# Patient Record
Sex: Male | Born: 1955 | Hispanic: No | Marital: Married | Smoking: Current some day smoker
Health system: Southern US, Community
[De-identification: ages and names within clinical notes are randomized; demographics above are authoritative.]

## PROBLEM LIST (undated history)

## (undated) DIAGNOSIS — F329 Major depressive disorder, single episode, unspecified: Secondary | ICD-10-CM

## (undated) DIAGNOSIS — I10 Essential (primary) hypertension: Secondary | ICD-10-CM

## (undated) DIAGNOSIS — F1910 Other psychoactive substance abuse, uncomplicated: Secondary | ICD-10-CM

## (undated) DIAGNOSIS — F4310 Post-traumatic stress disorder, unspecified: Secondary | ICD-10-CM

## (undated) DIAGNOSIS — F17200 Nicotine dependence, unspecified, uncomplicated: Secondary | ICD-10-CM

## (undated) DIAGNOSIS — E669 Obesity, unspecified: Secondary | ICD-10-CM

## (undated) HISTORY — PX: INGUINAL HERNIA REPAIR: SUR1180

## (undated) HISTORY — DX: Essential (primary) hypertension: I10

## (undated) HISTORY — DX: Obesity, unspecified: E66.9

## (undated) HISTORY — DX: Major depressive disorder, single episode, unspecified: F32.9

## (undated) HISTORY — PX: TESTICLE SURGERY: SHX794

## (undated) HISTORY — DX: Nicotine dependence, unspecified, uncomplicated: F17.200

---

## 2008-12-04 ENCOUNTER — Emergency Department (HOSPITAL_COMMUNITY): Admission: EM | Admit: 2008-12-04 | Discharge: 2008-12-04 | Payer: Self-pay | Admitting: Emergency Medicine
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
## 2011-11-20 ENCOUNTER — Ambulatory Visit: Payer: Self-pay

## 2011-11-20 DIAGNOSIS — Z0289 Encounter for other administrative examinations: Secondary | ICD-10-CM

## 2016-12-30 ENCOUNTER — Emergency Department (HOSPITAL_COMMUNITY): Payer: 59

## 2016-12-30 ENCOUNTER — Emergency Department (HOSPITAL_COMMUNITY)
Admission: EM | Admit: 2016-12-30 | Discharge: 2016-12-30 | Disposition: A | Discharge status: Home / Self Care | Payer: 59 | Attending: Emergency Medicine | Admitting: Emergency Medicine

## 2016-12-30 ENCOUNTER — Encounter (HOSPITAL_COMMUNITY): Payer: Self-pay

## 2016-12-30 DIAGNOSIS — R030 Elevated blood-pressure reading, without diagnosis of hypertension: Secondary | ICD-10-CM | POA: Insufficient documentation

## 2016-12-30 DIAGNOSIS — F17200 Nicotine dependence, unspecified, uncomplicated: Secondary | ICD-10-CM | POA: Insufficient documentation

## 2016-12-30 DIAGNOSIS — M954 Acquired deformity of chest and rib: Secondary | ICD-10-CM | POA: Insufficient documentation

## 2016-12-30 DIAGNOSIS — Z7982 Long term (current) use of aspirin: Secondary | ICD-10-CM | POA: Diagnosis not present

## 2016-12-30 DIAGNOSIS — R0781 Pleurodynia: Secondary | ICD-10-CM | POA: Diagnosis present

## 2016-12-30 LAB — URINALYSIS, ROUTINE W REFLEX MICROSCOPIC
BACTERIA UA: NONE SEEN
Bilirubin Urine: NEGATIVE
Glucose, UA: NEGATIVE mg/dL
Ketones, ur: NEGATIVE mg/dL
Leukocytes, UA: NEGATIVE
Nitrite: NEGATIVE
PROTEIN: 30 mg/dL — AB
SPECIFIC GRAVITY, URINE: 1.026 (ref 1.005–1.030)
pH: 5 (ref 5.0–8.0)

## 2016-12-30 LAB — CBC
HEMATOCRIT: 46.1 % (ref 39.0–52.0)
HEMOGLOBIN: 16.2 g/dL (ref 13.0–17.0)
MCH: 33.1 pg (ref 26.0–34.0)
MCHC: 35.1 g/dL (ref 30.0–36.0)
MCV: 94.3 fL (ref 78.0–100.0)
Platelets: 205 10*3/uL (ref 150–400)
RBC: 4.89 MIL/uL (ref 4.22–5.81)
RDW: 13.5 % (ref 11.5–15.5)
WBC: 9.1 10*3/uL (ref 4.0–10.5)

## 2016-12-30 LAB — COMPREHENSIVE METABOLIC PANEL
ALBUMIN: 4.6 g/dL (ref 3.5–5.0)
ALK PHOS: 58 U/L (ref 38–126)
ALT: 36 U/L (ref 17–63)
ANION GAP: 7 (ref 5–15)
AST: 36 U/L (ref 15–41)
BILIRUBIN TOTAL: 0.5 mg/dL (ref 0.3–1.2)
BUN: 13 mg/dL (ref 6–20)
CALCIUM: 9.5 mg/dL (ref 8.9–10.3)
CO2: 26 mmol/L (ref 22–32)
Chloride: 104 mmol/L (ref 101–111)
Creatinine, Ser: 0.91 mg/dL (ref 0.61–1.24)
GFR calc Af Amer: >60 mL/min (ref >60)
GFR calc non Af Amer: >60 mL/min (ref >60)
GLUCOSE: 119 mg/dL — AB (ref 65–99)
Potassium: 4.3 mmol/L (ref 3.5–5.1)
SODIUM: 137 mmol/L (ref 135–145)
TOTAL PROTEIN: 8 g/dL (ref 6.5–8.1)

## 2016-12-30 LAB — LIPASE, BLOOD: Lipase: 23 U/L (ref 11–51)

## 2016-12-30 NOTE — ED Provider Notes (Signed)
[place] DEPT Provider Note   CSN: [id number] Arrival date & time: 12/30/16  1147     History   Chief Complaint Chief Complaint  Patient presents with  . Abdominal Pain    HPI William Pitts is a 61 y.o. male.  Patient is a 61 year old male who presents with right lower rib pain. He states it started about 4 months ago when he an air conditioner to install. He states it's been hurting intermittently since that time. It mostly comes on when he is lifting something or turning a certain way. It's nonradiating. He denies any abdominal pain. He denies any nausea or vomiting. He has normal appetite. No bloating. He did have an episode of vomiting and diarrhea yesterday which he feels is more of a stomach virus. He's had no further episodes since that time. He denies any fevers. No pleuritic symptoms. No shortness of breath. He immigrated here from [place]. He's been here for 18 years but has not seen a PCP during that time. He does say that he has health insurance just hasn't established care with a PCP. He does note that his blood pressures have been persistently [id number] systolic and several years ago he just stopped checking it.      History reviewed. No pertinent past medical history.  There are no active problems to display for this patient.   History reviewed. No pertinent surgical history.     Home Medications    Prior to Admission medications   Medication Sig Start Date End Date Taking? Authorizing Provider  aspirin EC 81 MG tablet Take 81 mg by mouth daily.   Yes Historical Provider, MD  CALCIUM PO Take 1 tablet by mouth daily.   Yes Historical Provider, MD  GARLIC PO Take 1 capsule by mouth 3 (three) times daily.   Yes Historical Provider, MD  Omega-3 Fatty Acids (FISH OIL PO) Take 1 capsule by mouth daily.   Yes Historical Provider, MD    Family History History reviewed. No pertinent family history.  Social History Social History  Substance Use Topics  .  Smoking status: Current Every Day Smoker  . Smokeless tobacco: Never Used  . Alcohol use Yes     Comment: social     Allergies   Patient has no known allergies.   Review of Systems Review of Systems  Constitutional: Negative for chills, diaphoresis, fatigue and fever.  HENT: Negative for congestion, rhinorrhea and sneezing.   Eyes: Negative.   Respiratory: Negative for cough, chest tightness and shortness of breath.   Cardiovascular: Positive for chest pain. Negative for leg swelling.  Gastrointestinal: Positive for diarrhea, nausea and vomiting. Negative for abdominal pain and blood in stool.  Genitourinary: Negative for difficulty urinating, flank pain, frequency and hematuria.  Musculoskeletal: Negative for arthralgias and back pain.  Skin: Negative for rash.  Neurological: Negative for dizziness, speech difficulty, weakness, numbness and headaches.     Physical Exam Updated Vital Signs BP (!) 166/102 (BP Location: Right Arm)   Pulse 99   Temp 98.9 F (37.2 C) (Oral)   Resp 20   SpO2 98%   Physical Exam  Constitutional: He is oriented to person, place, and time. He appears well-developed and well-nourished.  HENT:  Head: Normocephalic and atraumatic.  Eyes: Pupils are equal, round, and reactive to light.  Neck: Normal range of motion. Neck supple.  Cardiovascular: Normal rate, regular rhythm and normal heart sounds.   Pulmonary/Chest: Effort normal and breath sounds normal. No respiratory distress. He has  no wheezes. He has no rales. He exhibits tenderness.  Point tenderness to the anterior right lower ribs. There is no underlying abdominal tenderness. No overlying rash. No crepitus or deformity.  Abdominal: Soft. Bowel sounds are normal. There is no tenderness. There is no rebound and no guarding.  Musculoskeletal: Normal range of motion. He exhibits no edema.  No edema or calf tenderness  Lymphadenopathy:    He has no cervical adenopathy.  Neurological: He is  alert and oriented to person, place, and time.  Skin: Skin is warm and dry. No rash noted.  Psychiatric: He has a normal mood and affect.     ED Treatments / Results  Labs (all labs ordered are listed, but only abnormal results are displayed) Labs Reviewed  COMPREHENSIVE METABOLIC PANEL - Abnormal; Notable for the following:       Result Value   Glucose, Bld 119 (*)    All other components within normal limits  URINALYSIS, ROUTINE W REFLEX MICROSCOPIC - Abnormal; Notable for the following:    Color, Urine AMBER (*)    Hgb urine dipstick SMALL (*)    Protein, ur 30 (*)    Squamous Epithelial / LPF 0-5 (*)    All other components within normal limits  LIPASE, BLOOD  CBC    EKG  EKG Interpretation None       Radiology Dg Ribs Unilateral W/chest Right  Result Date: 12/30/2016 CLINICAL DATA:  RIGHT lower anterior rib cage pain beginning 5 months ago, question related to muscle injury at work, went away a month ago now back, vomited once last night, smoker EXAM: RIGHT RIBS AND CHEST - 3+ VIEW COMPARISON:  None FINDINGS: Normal heart size, mediastinal contours, and pulmonary vascularity. Minimal peribronchial thickening. Lungs otherwise clear. No infiltrate, pleural effusion or pneumothorax. BB placed at site of symptoms lower RIGHT chest. Question expansile bone lesion at the anterolateral RIGHT fifth rib. Additionally, enlargement of the posterior RIGHT twelfth rib question expansile lesion. No definite acute rib fracture. IMPRESSION: Question expansile bone lesions at the posterior RIGHT twelfth and anterolateral RIGHT fifth ribs. Consider radionuclide bone scan determine if abnormal metabolic activity is seen at these sites in order to assess for potential osseous metastatic disease. Electronically Signed   By: Lavonia Dana M.D.   On: 12/30/2016 18:10    Procedures Procedures (including critical care time)  Medications Ordered in ED Medications - No data to display   Initial  Impression / Assessment and Plan / ED Course  I have reviewed the triage vital signs and the nursing notes.  Pertinent labs & imaging results that were available during my care of the patient were reviewed by me and considered in my medical decision making (see chart for details).     Patient presents with right-sided rib pain. It's reproducible on palpation. There is no shortness of breath, pleuritic symptoms or other symptoms that would be more suggestive of pulmonary embolus. Chest x-ray shows some abnormal rib lesions. He does have insurance and had given him resources to call for establishment with a primary care provider. He will need a bone scan. I encouraged him to do this as soon as possible. His blood pressure has been elevated in the ED. I advised him he wanted to have this followed as well by his PCP.  Final Clinical Impressions(s) / ED Diagnoses   Final diagnoses:  Rib deformity  Elevated BP without diagnosis of hypertension    New Prescriptions New Prescriptions   No medications on file  Malvin Johns, MD 12/30/16 [phone]

## 2016-12-30 NOTE — ED Triage Notes (Signed)
Pt states 5 months ago he started having rt abdominal pain. Felt related to muscle injury from work.  Pt states it went away about 1 month ago.  Now it is coming back.  However, pt also describes vomiting x 1 last night.

## 2016-12-30 NOTE — Discharge Instructions (Signed)
You have some abnormal lesions to right ribs. He need a follow-up bone scan. Please establish care as soon as possible with her primary care provider to obtain his follow-up.

## 2017-01-08 ENCOUNTER — Telehealth: Payer: Self-pay | Admitting: Family Medicine

## 2017-01-08 ENCOUNTER — Encounter: Payer: Self-pay | Admitting: Family Medicine

## 2017-01-08 ENCOUNTER — Ambulatory Visit (INDEPENDENT_AMBULATORY_CARE_PROVIDER_SITE_OTHER): Payer: 59 | Admitting: Family Medicine

## 2017-01-08 VITALS — BP 148/86 | HR 80 | Temp 98.2°F | Ht 68.5 in | Wt 214.2 lb

## 2017-01-08 DIAGNOSIS — Q766 Other congenital malformations of ribs: Secondary | ICD-10-CM | POA: Insufficient documentation

## 2017-01-08 DIAGNOSIS — F17200 Nicotine dependence, unspecified, uncomplicated: Secondary | ICD-10-CM | POA: Insufficient documentation

## 2017-01-08 DIAGNOSIS — I10 Essential (primary) hypertension: Secondary | ICD-10-CM

## 2017-01-08 DIAGNOSIS — F329 Major depressive disorder, single episode, unspecified: Secondary | ICD-10-CM

## 2017-01-08 DIAGNOSIS — E669 Obesity, unspecified: Secondary | ICD-10-CM

## 2017-01-08 HISTORY — DX: Essential (primary) hypertension: I10

## 2017-01-08 HISTORY — DX: Major depressive disorder, single episode, unspecified: F32.9

## 2017-01-08 HISTORY — DX: Nicotine dependence, unspecified, uncomplicated: F17.200

## 2017-01-08 HISTORY — DX: Obesity, unspecified: E66.9

## 2017-01-08 LAB — COMPREHENSIVE METABOLIC PANEL
ALT: 38 U/L (ref 0–53)
AST: 31 U/L (ref 0–37)
Albumin: 4.3 g/dL (ref 3.5–5.2)
Alkaline Phosphatase: 61 U/L (ref 39–117)
BUN: 13 mg/dL (ref 6–23)
CO2: 30 mEq/L (ref 19–32)
Calcium: 9.4 mg/dL (ref 8.4–10.5)
Chloride: 108 mEq/L (ref 96–112)
Creatinine, Ser: 0.83 mg/dL (ref 0.40–1.50)
GFR: 100.78 mL/min (ref >60.00)
Glucose, Bld: 101 mg/dL — ABNORMAL HIGH (ref 70–99)
Potassium: 4.8 mEq/L (ref 3.5–5.1)
Sodium: 140 mEq/L (ref 135–145)
Total Bilirubin: 0.5 mg/dL (ref 0.2–1.2)
Total Protein: 7 g/dL (ref 6.0–8.3)

## 2017-01-08 LAB — CBC WITH DIFFERENTIAL/PLATELET
Basophils Absolute: 0.1 10*3/uL (ref 0.0–0.1)
Basophils Relative: 0.7 % (ref 0.0–3.0)
Eosinophils Absolute: 0.2 10*3/uL (ref 0.0–0.7)
Eosinophils Relative: 1.9 % (ref 0.0–5.0)
HCT: 47.1 % (ref 39.0–52.0)
Hemoglobin: 15.8 g/dL (ref 13.0–17.0)
Lymphocytes Relative: 29.9 % (ref 12.0–46.0)
Lymphs Abs: 2.5 10*3/uL (ref 0.7–4.0)
MCHC: 33.6 g/dL (ref 30.0–36.0)
MCV: 99 fl (ref 78.0–100.0)
Monocytes Absolute: 0.8 10*3/uL (ref 0.1–1.0)
Monocytes Relative: 9.7 % (ref 3.0–12.0)
Neutro Abs: 4.8 10*3/uL (ref 1.4–7.7)
Neutrophils Relative %: 57.8 % (ref 43.0–77.0)
Platelets: 287 10*3/uL (ref 150.0–400.0)
RBC: 4.76 Mil/uL (ref 4.22–5.81)
RDW: 13.9 % (ref 11.5–15.5)
WBC: 8.2 10*3/uL (ref 4.0–10.5)

## 2017-01-08 LAB — LIPID PANEL
Cholesterol: 186 mg/dL (ref 0–200)
HDL: 69.7 mg/dL (ref >39.00)
LDL Cholesterol: 108 mg/dL — ABNORMAL HIGH (ref 0–99)
NonHDL: 116.7
Total CHOL/HDL Ratio: 3
Triglycerides: 43 mg/dL (ref 0.0–149.0)
VLDL: 8.6 mg/dL (ref 0.0–40.0)

## 2017-01-08 LAB — PSA: PSA: 1.58 ng/mL (ref 0.10–4.00)

## 2017-01-08 MED ORDER — LISINOPRIL-HYDROCHLOROTHIAZIDE 10-12.5 MG PO TABS: 1.0000 | ORAL_TABLET | Freq: Every day | ORAL | 1 refills | Status: DC

## 2017-01-08 NOTE — Progress Notes (Signed)
Pre visit review using our clinic review tool, if applicable. No additional management support is needed unless otherwise documented below in the visit note. 

## 2017-01-08 NOTE — Telephone Encounter (Signed)
Call patient. HTN med sent. StarT at half dose x 2 weeks. Come back for nurse visit for BP check and recheck BMP.

## 2017-01-08 NOTE — Progress Notes (Signed)
William Pitts is a 61 y.o. male is here to [place].    History of Present Illness:    1. Abnormality of rib determined by X-ray. The patient went to the ER last week for right lower rib pain. An xray done at that time:  Question expansile bone lesions at the posterior RIGHT twelfth and anterolateral RIGHT fifth ribs. General ROS: negative for - chills, fever, night sweats or weight loss.   2. HTN, goal below 140/80. No Hx of taking medications. BP elevated at recent visit to Er. Cardiovascular ROS: negative for - chest pain, dyspnea on exertion, edema or palpitations.    3. Smoker. Since he was a teenager. Not interested in cessation.    4. Reactive depression. Son committed suicide last month. Has support at home. No SI/HI.    5. Obesity (BMI 30-39.9). Not watching diet or exercise.     Health Maintenance Due  Topic Date Due  . Hepatitis C Screening  [birth year]  . TETANUS/TDAP  01/01/1977  . COLONOSCOPY  01/02/2008    PMHx, SurgHx, SocialHx, Medications, and Allergies were reviewed in the Visit Navigator and updated as appropriate.    Past Medical History:  Diagnosis Date  . Smoker     History reviewed. No pertinent surgical history.  History reviewed. No pertinent family history.  Social History  Substance Use Topics  . Smoking status: Current Every Day Smoker    Packs/day: 1.00    Types: Cigarettes  . Smokeless tobacco: Never Used  . Alcohol use Yes     Comment: social     Current Medications and Allergies:    Current Outpatient Prescriptions:  .  aspirin EC 81 MG tablet, Take 81 mg by mouth daily., Disp: , Rfl:  .  CALCIUM PO, Take 1 tablet by mouth daily., Disp: , Rfl:  .  GARLIC PO, Take 1 capsule by mouth 3 (three) times daily., Disp: , Rfl:  .  Omega-3 Fatty Acids (FISH OIL PO), Take 1 capsule by mouth daily., Disp: , Rfl:    No Known Allergies    Patient Information Form: Screening and ROS    Do you feel safe in relationships?  yes PHQ-2: positive  Review of Systems  General:  Negative for unexplained weight loss, fever Skin: Negative for new or changing mole, sore that won't heal HEENT: Negative for trouble hearing, trouble seeing, ringing in ears, mouth sores, hoarseness, change in voice, dysphagia CV:  Negative for chest pain, dyspnea, edema, palpitations Resp: Negative for cough, dyspnea, hemoptysis GI: Negative for nausea, vomiting, diarrhea, constipation, abdominal pain, melena, hematochezia GU: Negative for dysuria, incontinence, urinary hesitance, hematuria, vaginal or penile discharge, polyuria, sexual difficulty, lumps in testicle or breasts MSK: Negative for muscle cramps or aches, joint pain or swelling Neuro: Negative for headaches, weakness, numbness, dizziness, passing out/fainting Psych: Negative for memory problems, SI/HI   Vitals:   Vitals:   01/08/17 0849  BP: (!) 148/86  Pulse: 80  Temp: 98.2 F (36.8 C)  TempSrc: Oral  SpO2: 95%  Weight: 214 lb 3.2 oz (97.2 kg)  Height: 5' 8.5" (1.74 m)     Body mass index is 32.09 kg/m.   Physical Exam:     General: Alert, cooperative, appears stated age and no distress.  HEENT:  Normocephalic, without obvious abnormality, atraumatic. Conjunctivae/corneas clear. PERRL, EOM's intact. Normal TM's and external ear canals both ears. Nares normal. Septum midline. Mucosa normal. No drainage or sinus tenderness. Lips, mucosa, and tongue normal; teeth and  gums normal.  Lungs: Clear to auscultation bilaterally.  Heart:: Regular rate and rhythm, S1, S2 normal, no murmur, click, rub or gallop.  Abdomen: Soft, non-tender; bowel sounds normal; no masses,  no organomegaly.  Extremities: Extremities normal, atraumatic, no cyanosis or edema.  Pulses: 2+ and symmetric.  Skin: Skin color, texture, turgor normal. No rashes or lesions.  Neurologic: Alert and oriented X 3, normal strength and tone. Normal symmetric. reflexes. Normal coordination and gait.   Psych: Alert,oriented, in NAD with a full range of affect, normal behavior and no psychotic features    Results for orders placed or performed in visit on 01/08/17  CBC with Differential/Platelet  Result Value Ref Range   WBC 8.2 4.0 - 10.5 K/uL   RBC 4.76 4.22 - 5.81 Mil/uL   Hemoglobin 15.8 13.0 - 17.0 g/dL   HCT 47.1 39.0 - 52.0 %   MCV 99.0 78.0 - 100.0 fl   MCHC 33.6 30.0 - 36.0 g/dL   RDW 13.9 11.5 - 15.5 %   Platelets 287.0 150.0 - 400.0 K/uL   Neutrophils Relative % 57.8 43.0 - 77.0 %   Lymphocytes Relative 29.9 12.0 - 46.0 %   Monocytes Relative 9.7 3.0 - 12.0 %   Eosinophils Relative 1.9 0.0 - 5.0 %   Basophils Relative 0.7 0.0 - 3.0 %   Neutro Abs 4.8 1.4 - 7.7 K/uL   Lymphs Abs 2.5 0.7 - 4.0 K/uL   Monocytes Absolute 0.8 0.1 - 1.0 K/uL   Eosinophils Absolute 0.2 0.0 - 0.7 K/uL   Basophils Absolute 0.1 0.0 - 0.1 K/uL  Lipid panel  Result Value Ref Range   Cholesterol 186 0 - 200 mg/dL   Triglycerides 43.0 0.0 - 149.0 mg/dL   HDL 69.70 >39.00 mg/dL   VLDL 8.6 0.0 - 40.0 mg/dL   LDL Cholesterol 108 (H) 0 - 99 mg/dL   Total CHOL/HDL Ratio 3    NonHDL 116.70   Comprehensive metabolic panel  Result Value Ref Range   Sodium 140 135 - 145 mEq/L   Potassium 4.8 3.5 - 5.1 mEq/L   Chloride 108 96 - 112 mEq/L   CO2 30 19 - 32 mEq/L   Glucose, Bld 101 (H) 70 - 99 mg/dL   BUN 13 6 - 23 mg/dL   Creatinine, Ser 0.83 0.40 - 1.50 mg/dL   Total Bilirubin 0.5 0.2 - 1.2 mg/dL   Alkaline Phosphatase 61 39 - 117 U/L   AST 31 0 - 37 U/L   ALT 38 0 - 53 U/L   Total Protein 7.0 6.0 - 8.3 g/dL   Albumin 4.3 3.5 - 5.2 g/dL   Calcium 9.4 8.4 - 10.5 mg/dL   GFR 100.78 >60.00 mL/min  PSA  Result Value Ref Range   PSA 1.58 0.10 - 4.00 ng/mL      Assessment and Plan:   William Pitts was seen today for establish care, hypertension and flank pain.  Diagnoses and all orders for this visit:  Abnormality of rib determined by X-ray Comments: Bone scan as below. Orders: -     CBC with  Differential/Platelet -     PSA -     NM Bone Scan Whole Body; Future  HTN, goal below 140/80 Comments: Rx to start with 1/2 daily x 2 weeks. Monitor BP. Recheck BP and labs in 2 weeks.  Orders: -     lisinopril-hydrochlorothiazide (PRINZIDE,ZESTORETIC) 10-12.5 MG tablet; Take 1 tablet by mouth daily. START WITH 1/2 BY MOUTH DAILY X 2 WEEKS.  Smoker Comments:             Precontemplative stage.  Reactive depression Comments:  Patient declines treatment.  Obesity (BMI 30-39.9) Orders: -     Lipid panel -     Comprehensive metabolic panel  . Reviewed expectations re: course of current medical issues. . Discussed self-management of symptoms. . Outlined signs and symptoms indicating need for more acute intervention. . Patient verbalized understanding and all questions were answered. . See orders for this visit as documented in the electronic medical record. . Patient received an After Visit Summary.  Records requested if needed. I spent 45 minutes with this patient, greater than 50% was face-to-face time counseling regarding the above diagnoses.   Briscoe Deutscher, [place], [place] 01/08/2017   Follow-up: 2 weeks, nurse visit.  Meds ordered this encounter  Medications  . lisinopril-hydrochlorothiazide (PRINZIDE,ZESTORETIC) 10-12.5 MG tablet    Sig: Take 1 tablet by mouth daily. START WITH 1/2 BY MOUTH DAILY X 2 WEEKS.    Dispense:  90 tablet    Refill:  1   There are no discontinued medications. Orders Placed This Encounter  Procedures  . NM Bone Scan Whole Body  . CBC with Differential/Platelet  . Lipid panel  . Comprehensive metabolic panel  . PSA

## 2017-01-09 NOTE — Telephone Encounter (Signed)
LM for patient to return call.

## 2017-01-10 NOTE — Telephone Encounter (Signed)
LM for patient to return call.

## 2017-01-12 NOTE — Telephone Encounter (Signed)
Patient called multiple times. Will close encounter and call again when following up on bone scan.

## 2017-01-16 ENCOUNTER — Encounter (HOSPITAL_COMMUNITY): Payer: 59

## 2018-02-04 ENCOUNTER — Ambulatory Visit (INDEPENDENT_AMBULATORY_CARE_PROVIDER_SITE_OTHER): Payer: Managed Care, Other (non HMO) | Admitting: Family Medicine

## 2018-02-04 ENCOUNTER — Encounter: Payer: Self-pay | Admitting: Family Medicine

## 2018-02-04 ENCOUNTER — Telehealth: Payer: Self-pay

## 2018-02-04 ENCOUNTER — Encounter: Payer: Self-pay | Admitting: Gastroenterology

## 2018-02-04 VITALS — BP 130/100 | HR 72 | Temp 98.2°F | Ht 68.5 in | Wt 226.4 lb

## 2018-02-04 DIAGNOSIS — E669 Obesity, unspecified: Secondary | ICD-10-CM | POA: Diagnosis not present

## 2018-02-04 DIAGNOSIS — R0683 Snoring: Secondary | ICD-10-CM | POA: Diagnosis not present

## 2018-02-04 DIAGNOSIS — F5105 Insomnia due to other mental disorder: Secondary | ICD-10-CM

## 2018-02-04 DIAGNOSIS — Z1211 Encounter for screening for malignant neoplasm of colon: Secondary | ICD-10-CM

## 2018-02-04 DIAGNOSIS — F1020 Alcohol dependence, uncomplicated: Secondary | ICD-10-CM | POA: Diagnosis not present

## 2018-02-04 DIAGNOSIS — D170 Benign lipomatous neoplasm of skin and subcutaneous tissue of head, face and neck: Secondary | ICD-10-CM | POA: Diagnosis not present

## 2018-02-04 DIAGNOSIS — Q766 Other congenital malformations of ribs: Secondary | ICD-10-CM

## 2018-02-04 DIAGNOSIS — F17211 Nicotine dependence, cigarettes, in remission: Secondary | ICD-10-CM

## 2018-02-04 DIAGNOSIS — F4310 Post-traumatic stress disorder, unspecified: Secondary | ICD-10-CM | POA: Diagnosis not present

## 2018-02-04 DIAGNOSIS — I10 Essential (primary) hypertension: Secondary | ICD-10-CM

## 2018-02-04 DIAGNOSIS — H66002 Acute suppurative otitis media without spontaneous rupture of ear drum, left ear: Secondary | ICD-10-CM | POA: Insufficient documentation

## 2018-02-04 DIAGNOSIS — F329 Major depressive disorder, single episode, unspecified: Secondary | ICD-10-CM

## 2018-02-04 DIAGNOSIS — E782 Mixed hyperlipidemia: Secondary | ICD-10-CM | POA: Insufficient documentation

## 2018-02-04 DIAGNOSIS — F99 Mental disorder, not otherwise specified: Secondary | ICD-10-CM | POA: Insufficient documentation

## 2018-02-04 LAB — COMPREHENSIVE METABOLIC PANEL
ALT: 26 U/L (ref 0–53)
AST: 28 U/L (ref 0–37)
Albumin: 4.8 g/dL (ref 3.5–5.2)
Alkaline Phosphatase: 59 U/L (ref 39–117)
BUN: 19 mg/dL (ref 6–23)
CO2: 25 mEq/L (ref 19–32)
Calcium: 10.2 mg/dL (ref 8.4–10.5)
Chloride: 105 mEq/L (ref 96–112)
Creatinine, Ser: 1.04 mg/dL (ref 0.40–1.50)
GFR: 77.4 mL/min (ref >60.00)
Glucose, Bld: 119 mg/dL — ABNORMAL HIGH (ref 70–99)
Potassium: 4.9 mEq/L (ref 3.5–5.1)
Sodium: 143 mEq/L (ref 135–145)
Total Bilirubin: 0.7 mg/dL (ref 0.2–1.2)
Total Protein: 7.8 g/dL (ref 6.0–8.3)

## 2018-02-04 LAB — CBC WITH DIFFERENTIAL/PLATELET
Basophils Absolute: 0.1 10*3/uL (ref 0.0–0.1)
Basophils Relative: 0.8 % (ref 0.0–3.0)
Eosinophils Absolute: 0.3 10*3/uL (ref 0.0–0.7)
Eosinophils Relative: 3.7 % (ref 0.0–5.0)
HCT: 45.4 % (ref 39.0–52.0)
Hemoglobin: 15.4 g/dL (ref 13.0–17.0)
Lymphocytes Relative: 37.1 % (ref 12.0–46.0)
Lymphs Abs: 2.5 10*3/uL (ref 0.7–4.0)
MCHC: 34 g/dL (ref 30.0–36.0)
MCV: 95.9 fl (ref 78.0–100.0)
Monocytes Absolute: 0.8 10*3/uL (ref 0.1–1.0)
Monocytes Relative: 11.9 % (ref 3.0–12.0)
Neutro Abs: 3.2 10*3/uL (ref 1.4–7.7)
Neutrophils Relative %: 46.5 % (ref 43.0–77.0)
Platelets: 244 10*3/uL (ref 150.0–400.0)
RBC: 4.73 Mil/uL (ref 4.22–5.81)
RDW: 13.6 % (ref 11.5–15.5)
WBC: 6.8 10*3/uL (ref 4.0–10.5)

## 2018-02-04 LAB — LIPID PANEL
Cholesterol: 228 mg/dL — ABNORMAL HIGH (ref 0–200)
HDL: 94.1 mg/dL (ref >39.00)
LDL Cholesterol: 119 mg/dL — ABNORMAL HIGH (ref 0–99)
NonHDL: 133.61
Total CHOL/HDL Ratio: 2
Triglycerides: 74 mg/dL (ref 0.0–149.0)
VLDL: 14.8 mg/dL (ref 0.0–40.0)

## 2018-02-04 MED ORDER — TRAZODONE HCL 50 MG PO TABS: 50.0000 mg | ORAL_TABLET | Freq: Every evening | ORAL | 3 refills | Status: DC | PRN

## 2018-02-04 MED ORDER — LISINOPRIL-HYDROCHLOROTHIAZIDE 10-12.5 MG PO TABS: 1.0000 | ORAL_TABLET | Freq: Every day | ORAL | 3 refills | Status: DC

## 2018-02-04 MED ORDER — AMOXICILLIN 875 MG PO TABS: 875.0000 mg | ORAL_TABLET | Freq: Two times a day (BID) | ORAL | 0 refills | Status: DC

## 2018-02-04 NOTE — Assessment & Plan Note (Addendum)
Discussed sleep hygiene measures including regular sleep schedule, optimal sleep environment, and relaxing presleep rituals. Avoid daytime naps. Avoid caffeine after noon. Avoid excess alcohol. Avoid tobacco. Recommended daily exercise. After discussion, patient would like to start Trazodone. Expectations, risks, and potential side effects reviewed.

## 2018-02-04 NOTE — Assessment & Plan Note (Signed)
Lipid-lowering therapy was not prescribed due to: not needed yet. Will monitor.

## 2018-02-04 NOTE — Assessment & Plan Note (Signed)
The patient is asked to make an attempt to improve diet and exercise patterns to aid in medical management of this problem.  

## 2018-02-04 NOTE — Assessment & Plan Note (Signed)
New. Treatment with antibiotic today. Red flags reviewed. Will need to recheck at next visit.

## 2018-02-04 NOTE — Assessment & Plan Note (Signed)
The patient was counseled on the dangers of tobacco use, and was advised to quit.  Reviewed strategies to maximize success, including removing cigarettes and smoking materials from environment, stress management, support of family/friends, written materials, local smoking cessation programs (1-800-QUIT-NOW and [employer]MOKE[employer]) and pharmacotherapy. Greater than 3 minutes were spent on counseling today. 

## 2018-02-04 NOTE — Assessment & Plan Note (Signed)
Mobile submandibular mass, suspicious for lipoma. No issues with function. To specialist to dicuss intervention (GS versus ENT).

## 2018-02-04 NOTE — Assessment & Plan Note (Signed)
Avoiding excessive salt intake? []   YES  [x]   NO Trying to exercise on a regular basis? []   YES  [x]   NO Review: no TIAs, no chest pain on exertion, no dyspnea on exertion, no swelling of ankles.  Smoker: Stopped smoking cigarettes 3 months ago, vaping.

## 2018-02-04 NOTE — Telephone Encounter (Signed)
Patient has order for bone scan in chart can you make app for it or do we need new order?
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
## 2018-02-04 NOTE — Assessment & Plan Note (Signed)
Bone scan ordered last year, but not completed by patient. He states that he will do it now. New tender nodule in area of previous chest xray finding.

## 2018-02-04 NOTE — Progress Notes (Signed)
William Pitts is a 62 y.o. male is here for follow up.  History of Present Illness:   HPI: See Assessment and Plan section for Problem Based Charting of issues discussed today.   Health Maintenance Due  Topic Date Due  . Hepatitis C Screening  1956-07-27  . COLONOSCOPY  01/02/2008   Depression screen [facility] 2/9 02/04/2018 02/04/2018 01/08/2017  Decreased Interest 3 3 2   Down, Depressed, Hopeless 1 1 3   PHQ - 2 Score 4 4 5   Altered sleeping 0 0 0  Tired, decreased energy 1 1 0  Change in appetite 0 0 0  Feeling bad or failure about yourself  1 1 0  Trouble concentrating 0 0 0  Moving slowly or fidgety/restless 0 0 0  Suicidal thoughts 0 0 0  PHQ-9 Score 6 6 5   Difficult doing work/chores Not difficult at all Not difficult at all -   PMHx, SurgHx, SocialHx, FamHx, Medications, and Allergies were reviewed in the Visit Navigator and updated as appropriate.   Patient Active Problem List   Diagnosis Date Noted  . Alcohol dependence, daily use ([place]) 02/04/2018  . Insomnia due to other mental disorder 02/04/2018  . PTSD (post-traumatic stress disorder) 02/04/2018  . Lipoma of neck 02/04/2018  . Snores, not interested in sleep study at this time 02/04/2018  . Acute suppurative otitis media of left ear without spontaneous rupture of tympanic membrane 02/04/2018  . Mixed hyperlipidemia 02/04/2018  . 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 HTN, goal below 140/80, on Lisonopril-HCTZ 01/08/2017  . Nicotine use disorder, vape 01/08/2017  . Reactive depression 01/08/2017  . Obesity (BMI 30-39.9) 01/08/2017   Social History   Tobacco Use  . Smoking status: Current Every Day Smoker    Packs/day: 1.00    Types: Cigarettes  . Smokeless tobacco: Never Used  Substance Use Topics  . Alcohol use: Yes    Comment: social  . Drug use: No   Current Medications and Allergies:   .  aspirin EC 81 MG tablet, Take 81 mg by mouth daily., Disp: , Rfl:  .  CALCIUM PO, Take 1 tablet by  mouth daily., Disp: , Rfl:  .  GARLIC PO, Take 1 capsule by mouth 3 (three) times daily., Disp: , Rfl:  .  lisinopril-hydrochlorothiazide (PRINZIDE,ZESTORETIC) 10-12.5 MG tablet, Take 1 tablet by mouth daily. START WITH 1/2 BY MOUTH DAILY X 2 WEEKS., Disp: 90 tablet, Rfl: 1 - RAN OUT, SO NOT TAKING .  Omega-3 Fatty Acids (FISH OIL PO), Take 1 capsule by mouth daily., Disp: , Rfl:   No Known Allergies   Review of Systems   Pertinent items are noted in the HPI. Otherwise, ROS is negative.  Vitals:   Vitals:   02/04/18 0725  BP: (!) 130/100  Pulse: 72  Temp: 98.2 F (36.8 C)  TempSrc: Oral  SpO2: 98%  Weight: 226 lb 6.4 oz (102.7 kg)  Height: 5' 8.5" (1.74 m)     Body mass index is 33.92 kg/m.  Physical Exam:   Physical Exam  Constitutional: He is oriented to person, place, and time. He appears well-developed and well-nourished. No distress.  HENT:  Head: Normocephalic and atraumatic.  Right Ear: External ear normal.  Left Ear: External ear normal.  Nose: Nose normal.  Mouth/Throat: Oropharynx is clear and moist.  Eyes: Conjunctivae and EOM are normal. Pupils are equal, round, and reactive to light.  Neck: Normal range of motion. Neck supple.  Cardiovascular: Normal rate, regular  rhythm, normal heart sounds and intact distal pulses.  Pulmonary/Chest: Effort normal and breath sounds normal.  Abdominal: Soft. Bowel sounds are normal.  Musculoskeletal: Normal range of motion.  Neurological: He is alert and oriented to person, place, and time.  Skin: Skin is warm and dry.  Psychiatric: He has a normal mood and affect. His behavior is normal. Judgment and thought content normal.  Nursing note and vitals reviewed.   Results for orders placed or performed in visit on 02/04/18  Comprehensive metabolic panel  Result Value Ref Range   Sodium 143 135 - 145 mEq/L   Potassium 4.9 3.5 - 5.1 mEq/L   Chloride 105 96 - 112 mEq/L   CO2 25 19 - 32 mEq/L   Glucose, Bld 119 (H) 70 - 99  mg/dL   BUN 19 6 - 23 mg/dL   Creatinine, Ser 1.04 0.40 - 1.50 mg/dL   Total Bilirubin 0.7 0.2 - 1.2 mg/dL   Alkaline Phosphatase 59 39 - 117 U/L   AST 28 0 - 37 U/L   ALT 26 0 - 53 U/L   Total Protein 7.8 6.0 - 8.3 g/dL   Albumin 4.8 3.5 - 5.2 g/dL   Calcium 10.2 8.4 - 10.5 mg/dL   GFR 77.40 >60.00 mL/min  CBC with Differential/Platelet  Result Value Ref Range   WBC 6.8 4.0 - 10.5 K/uL   RBC 4.73 4.22 - 5.81 Mil/uL   Hemoglobin 15.4 13.0 - 17.0 g/dL   HCT 45.4 39.0 - 52.0 %   MCV 95.9 78.0 - 100.0 fl   MCHC 34.0 30.0 - 36.0 g/dL   RDW 13.6 11.5 - 15.5 %   Platelets 244.0 150.0 - 400.0 K/uL   Neutrophils Relative % 46.5 43.0 - 77.0 %   Lymphocytes Relative 37.1 12.0 - 46.0 %   Monocytes Relative 11.9 3.0 - 12.0 %   Eosinophils Relative 3.7 0.0 - 5.0 %   Basophils Relative 0.8 0.0 - 3.0 %   Neutro Abs 3.2 1.4 - 7.7 K/uL   Lymphs Abs 2.5 0.7 - 4.0 K/uL   Monocytes Absolute 0.8 0.1 - 1.0 K/uL   Eosinophils Absolute 0.3 0.0 - 0.7 K/uL   Basophils Absolute 0.1 0.0 - 0.1 K/uL  Lipid panel  Result Value Ref Range   Cholesterol 228 (H) 0 - 200 mg/dL   Triglycerides 74.0 0.0 - 149.0 mg/dL   HDL 94.10 >39.00 mg/dL   VLDL 14.8 0.0 - 40.0 mg/dL   LDL Cholesterol 119 (H) 0 - 99 mg/dL   Total CHOL/HDL Ratio 2    NonHDL 133.61    Assessment and Plan:   Patient Active Problem List   Diagnosis Date Noted  . Alcohol dependence, daily use ([place]) 02/04/2018  . Insomnia due to other mental disorder 02/04/2018  . PTSD (post-traumatic stress disorder) 02/04/2018  . Lipoma of neck 02/04/2018  . Snores, not interested in sleep study at this time 02/04/2018  . Acute suppurative otitis media of left ear without spontaneous rupture of tympanic membrane 02/04/2018  . Mixed hyperlipidemia 02/04/2018  . 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 HTN, goal below 140/80, on Lisonopril-HCTZ 01/08/2017  . Nicotine use disorder, vape 01/08/2017  . Reactive depression 01/08/2017  .  Obesity (BMI 30-39.9) 01/08/2017   1. HTN, goal below 140/80   2. Cigarette nicotine dependence in remission   3. Reactive depression   4. Obesity (BMI 30-39.9)   5. 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 Screening for colon  cancer   7. HTN, goal below 140/80   8. Alcohol dependence, daily use ([place])   9. Acute suppurative otitis media of left ear without spontaneous rupture of tympanic membrane, recurrence not specified   10. Insomnia due to other mental disorder   11. PTSD (post-traumatic stress disorder)   12. Lipoma of neck   13. Snores   14. Mixed hyperlipidemia    HTN, goal below 140/80, on Lisonopril-HCTZ Avoiding excessive salt intake? []   YES  [x]   NO Trying to exercise on a regular basis? []   YES  [x]   NO Review: no TIAs, no chest pain on exertion, no dyspnea on exertion, no swelling of ankles.  Smoker: Stopped smoking cigarettes 3 months ago, vaping.   Acute suppurative otitis media of left ear without spontaneous rupture of tympanic membrane New. Treatment with antibiotic today. Red flags reviewed. Will need to recheck at next visit.   Lipoma of neck Mobile submandibular mass, suspicious for lipoma. No issues with function. To specialist to dicuss intervention (GS versus ENT).   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 He states that he will do it now. New tender nodule in area of previous chest xray finding.   Alcohol dependence, daily use ([facility]) Daily wine intake. States that it is for stress and sleep. PTSD (due to war in his country) and loss of his son last year. We reviewed the importance of cessation and I offered help.   Insomnia due to other mental disorder Discussed sleep hygiene measures including regular sleep schedule, optimal sleep environment, and relaxing presleep rituals. Avoid daytime naps. Avoid caffeine after noon. Avoid excess alcohol. Avoid tobacco. Recommended daily exercise. After  discussion, patient would like to start Trazodone. Expectations, risks, and potential side effects reviewed.   Obesity (BMI 30-39.9) The patient is asked to make an attempt to improve diet and exercise patterns to aid in medical management of this problem.   Nicotine use disorder, vape The patient was counseled on the dangers of tobacco use, and was advised to quit.  Reviewed strategies to maximize success, including removing cigarettes and smoking materials from environment, stress management, support of family/friends, written materials, local smoking cessation programs (1-800-QUIT-NOW and SMOKEFREE.GOV) and pharmacotherapy. Greater than 3 minutes were spent on counseling today.  Mixed hyperlipidemia Lipid-lowering therapy was not prescribed due to: not needed yet. Will monitor.   Orders Placed This Encounter  Procedures  . Comprehensive metabolic panel  . CBC with Differential/Platelet  . Lipid panel  . Ambulatory referral to Gastroenterology  . Ambulatory referral to ENT   Meds ordered this encounter  Medications  . lisinopril-hydrochlorothiazide (PRINZIDE,ZESTORETIC) 10-12.5 MG tablet    Sig: Take 1 tablet by mouth daily. START WITH 1/2 BY MOUTH DAILY X 2 WEEKS.    Dispense:  90 tablet    Refill:  3  . amoxicillin (AMOXIL) 875 MG tablet    Sig: Take 1 tablet (875 mg total) by mouth 2 (two) times daily.    Dispense:  20 tablet    Refill:  0  . traZODone (DESYREL) 50 MG tablet    Sig: Take 1-2 tablets (50-100 mg total) by mouth at bedtime as needed for sleep.    Dispense:  60 tablet    Refill:  3   . Reviewed expectations re: course of current medical issues. . Discussed self-management of symptoms. . Outlined signs and symptoms indicating need for more acute intervention. . Patient verbalized understanding and all  questions were answered. Marland Kitchen Health Maintenance issues including appropriate healthy diet, exercise, and smoking avoidance were discussed with patient. . See  orders for this visit as documented in the electronic medical record. . Patient received an After Visit Summary.  CMA served as Education administrator during this visit. History, Physical, and Plan performed by medical provider. The above documentation has been reviewed and is accurate and complete. Briscoe Deutscher, D.O.  Briscoe Deutscher, DO [place], [place] 02/04/2018  Records requested if needed. Time spent with the patient: 45 minutes, of which >50% was spent in obtaining information about his symptoms, reviewing his previous labs, evaluations, and treatments, counseling him about his condition (please see the discussed topics above), and developing a plan to further investigate it; he had a number of questions which I addressed.

## 2018-02-05 NOTE — Telephone Encounter (Signed)
We would need to place a new order for this now.

## 2018-02-05 NOTE — Telephone Encounter (Signed)
New order placed

## 2018-02-08 ENCOUNTER — Encounter: Payer: Self-pay | Admitting: Family Medicine

## 2018-02-16 ENCOUNTER — Encounter (HOSPITAL_BASED_OUTPATIENT_CLINIC_OR_DEPARTMENT_OTHER): Payer: Self-pay | Admitting: *Deleted

## 2018-02-16 ENCOUNTER — Other Ambulatory Visit: Payer: Self-pay

## 2018-02-17 ENCOUNTER — Ambulatory Visit: Payer: Self-pay | Admitting: Otolaryngology

## 2018-02-17 NOTE — H&P (Signed)
PREOPERATIVE H&P  Chief Complaint: new submental mass  HPI: William Pitts is a 62 y.o. male who presents for evaluation of submental mass that he has had for several months. He felt like it has previously been drained in the past but has been persistent. It measures approximately 2-2 and half centimeters in size and he would like to have it removed. He is taken to the operating room at this time to have it removed under local anesthetic.  Past Medical History:  Diagnosis Date  . HTN, goal below 140/80 01/08/2017  . Nicotine use disorder 01/08/2017  . Obesity (BMI 30-39.9) 01/08/2017  . PTSD (post-traumatic stress disorder)   . Reactive depression 01/08/2017   History reviewed. No pertinent surgical history. Social History   Socioeconomic History  . Marital status: Married    Spouse name: Not on file  . Number of children: Not on file  . Years of education: Not on file  . Highest education level: Not on file  Occupational History  . Not on file  Social Needs  . Financial resource strain: Not on file  . Food insecurity:    Worry: Not on file    Inability: Not on file  . Transportation needs:    Medical: Not on file    Non-medical: Not on file  Tobacco Use  . Smoking status: Former Smoker    Packs/day: 1.00    Types: Cigarettes    Last attempt to quit: 02/02/2018    Years since quitting: 0.0  . Smokeless tobacco: Never Used  . Tobacco comment: vapes with nicotene now  Substance and Sexual Activity  . Alcohol use: Yes    Comment: daily wine  . Drug use: No  . Sexual activity: Not on file  Lifestyle  . Physical activity:    Days per week: Not on file    Minutes per session: Not on file  . Stress: Not on file  Relationships  . Social connections:    Talks on phone: Not on file    Gets together: Not on file    Attends religious service: Not on file    Active member of club or organization: Not on file    Attends meetings of clubs or organizations: Not on file   Relationship status: Not on file  Other Topics Concern  . Not on file  Social History Narrative   From [place]. Has not seen a PCP in 19 years per patient. Smoker. Son committed suicide "over a girl" last month. 01/08/2017   History reviewed. No pertinent family history. No Known Allergies Prior to Admission medications   Medication Sig Start Date End Date Taking? Authorizing Provider  amoxicillin (AMOXIL) 875 MG tablet Take 1 tablet (875 mg total) by mouth 2 (two) times daily. 02/04/18  Yes Briscoe Deutscher, DO  aspirin EC 81 MG tablet Take 81 mg by mouth daily.   Yes [provider]  CALCIUM PO Take 1 tablet by mouth daily.   Yes [provider]  GARLIC PO Take 1 capsule by mouth 3 (three) times daily.   Yes [provider]  lisinopril-hydrochlorothiazide (PRINZIDE,ZESTORETIC) 10-12.5 MG tablet Take 1 tablet by mouth daily. START WITH 1/2 BY MOUTH DAILY X 2 WEEKS. 02/04/18  Yes Briscoe Deutscher, DO  Omega-3 Fatty Acids (FISH OIL PO) Take 1 capsule by mouth daily.   Yes [provider]  traZODone (DESYREL) 50 MG tablet Take 1-2 tablets (50-100 mg total) by mouth at bedtime as needed for sleep. 02/04/18  Yes Briscoe Deutscher, DO     Positive ROS: no pain associated with the mass  All other systems have been reviewed and were otherwise negative with the exception of those mentioned in the HPI and as above.  Physical Exam: There were no vitals filed for this visit.  General: Alert, no acute distress Oral: Normal oral mucosa and tonsils Nasal: Clear nasal passages Neck: No palpable adenopathy or thyroid nodules. 2 x 2.5 cm submental subdermal mass consistent with possible cyst or lipoma. Ear: Ear canal is clear with normal appearing TMs Cardiovascular: Regular rate and rhythm, no murmur.  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

## 2018-02-20 ENCOUNTER — Encounter (HOSPITAL_BASED_OUTPATIENT_CLINIC_OR_DEPARTMENT_OTHER): Payer: Self-pay | Admitting: Certified Registered"

## 2018-02-20 ENCOUNTER — Ambulatory Visit (HOSPITAL_BASED_OUTPATIENT_CLINIC_OR_DEPARTMENT_OTHER)
Admission: RE | Admit: 2018-02-20 | Payer: Managed Care, Other (non HMO) | Source: Ambulatory Visit | Admitting: Otolaryngology

## 2018-02-20 HISTORY — DX: Post-traumatic stress disorder, unspecified: F43.10

## 2018-02-20 SURGERY — EXCISION, MASS, NECK
Anesthesia: General

## 2018-02-20 MED ORDER — LIDOCAINE-EPINEPHRINE 1 %-1:100000 IJ SOLN
INTRAMUSCULAR | Status: AC
  Filled 2018-02-20: qty 1

## 2018-02-20 MED ORDER — LIDOCAINE HCL (CARDIAC) 20 MG/ML IV SOLN
INTRAVENOUS | Status: AC
  Filled 2018-02-20: qty 5

## 2018-02-20 MED ORDER — ROCURONIUM BROMIDE 10 MG/ML (PF) SYRINGE
PREFILLED_SYRINGE | INTRAVENOUS | Status: AC
Start: 2018-02-20 — End: 2018-02-20
  Filled 2018-02-20: qty 5

## 2018-02-20 MED ORDER — FENTANYL CITRATE (PF) 100 MCG/2ML IJ SOLN
INTRAMUSCULAR | Status: AC
Start: 2018-02-20 — End: 2018-02-20
  Filled 2018-02-20: qty 2

## 2018-02-20 MED ORDER — PROPOFOL 10 MG/ML IV BOLUS
INTRAVENOUS | Status: AC
  Filled 2018-02-20: qty 40

## 2018-02-20 MED ORDER — NEOSTIGMINE METHYLSULFATE 5 MG/5ML IV SOSY
PREFILLED_SYRINGE | INTRAVENOUS | Status: AC
  Filled 2018-02-20: qty 5

## 2018-02-20 MED ORDER — MIDAZOLAM HCL 2 MG/2ML IJ SOLN
INTRAMUSCULAR | Status: AC
  Filled 2018-02-20: qty 2

## 2018-02-20 MED ORDER — CIPROFLOXACIN-DEXAMETHASONE 0.3-0.1 % OT SUSP
OTIC | Status: AC
  Filled 2018-02-20: qty 7.5

## 2018-02-20 MED ORDER — BACITRACIN ZINC 500 UNIT/GM EX OINT
TOPICAL_OINTMENT | CUTANEOUS | Status: AC
Start: 2018-02-20 — End: 2018-02-20
  Filled 2018-02-20: qty 28.35

## 2018-04-10 ENCOUNTER — Encounter: Payer: Managed Care, Other (non HMO) | Admitting: Gastroenterology

## 2018-04-28 ENCOUNTER — Encounter: Payer: Self-pay | Admitting: Family Medicine

## 2019-02-15 ENCOUNTER — Other Ambulatory Visit: Payer: Self-pay | Admitting: Family Medicine

## 2019-02-15 DIAGNOSIS — I10 Essential (primary) hypertension: Secondary | ICD-10-CM

## 2019-03-07 NOTE — Progress Notes (Signed)
Virtual Visit via Video   I connected with William Pitts on 03/08/19 at 11:20 AM EDT by a video enabled telemedicine application and verified that I am speaking with the correct person using two identifiers. Location patient: Home Location provider: [facility], Office Persons participating in the virtual visit: William Pitts, William Deutscher, DO William Pitts, CMA acting as scribe for Dr. Briscoe Pitts. 225  I discussed the limitations of evaluation and management by telemedicine and the availability of in person appointments. The patient expressed understanding and agreed to proceed.  Subjective:   HPI: Patient has not been seen in over a year. He needed to be seen for more refills. He is drinking 1/4 gallon of white wine daily. He is smoking 1ppd. He did not have surgery due to cost. He has not followed up with that at all.   He has not had any symptoms with blood pressure. He has not had any chest pain dizziness, or swelling. His blood pressures have been doing well. He will come in back in August for physical.    Reviewed all precautions and expectations with prevention of Covid-19. He is still working. We have reviewed all symptoms with him. He will give our office a call if he starts having any new symptoms.   ROS: See pertinent positives and negatives per HPI.  Patient Active Problem List   Diagnosis Date Noted  . Alcohol dependence, daily use ([place]) 02/04/2018  . Insomnia due to other mental disorder 02/04/2018  . PTSD (post-traumatic stress disorder) 02/04/2018  . Lipoma of neck 02/04/2018  . Snores, not interested in sleep study at this time 02/04/2018  . Acute suppurative otitis media of left ear without spontaneous rupture of tympanic membrane 02/04/2018  . Mixed hyperlipidemia 02/04/2018  . 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 HTN, goal below 140/80, on Lisonopril-HCTZ 01/08/2017  . Nicotine use disorder, vape 01/08/2017  . Reactive depression  01/08/2017  . Obesity (BMI 30-39.9) 01/08/2017    Social History   Tobacco Use  . Smoking status: Former Smoker    Packs/day: 1.00    Types: Cigarettes    Last attempt to quit: 02/02/2018    Years since quitting: 1.0  . Smokeless tobacco: Never Used  . Tobacco comment: vapes with nicotene now  Substance Use Topics  . Alcohol use: Yes    Comment: daily wine    Current Outpatient Medications:  .  amoxicillin (AMOXIL) 875 MG tablet, Take 1 tablet (875 mg total) by mouth 2 (two) times daily., Disp: 20 tablet, Rfl: 0 .  aspirin EC 81 MG tablet, Take 81 mg by mouth daily., Disp: , Rfl:  .  CALCIUM PO, Take 1 tablet by mouth daily., Disp: , Rfl:  .  GARLIC PO, Take 1 capsule by mouth 3 (three) times daily., Disp: , Rfl:  .  lisinopril-hydrochlorothiazide (PRINZIDE,ZESTORETIC) 10-12.5 MG tablet, Take 1 tablet by mouth daily. START WITH 1/2 BY MOUTH DAILY X 2 WEEKS., Disp: 90 tablet, Rfl: 3 .  Omega-3 Fatty Acids (FISH OIL PO), Take 1 capsule by mouth daily., Disp: , Rfl:  .  traZODone (DESYREL) 50 MG tablet, Take 1-2 tablets (50-100 mg total) by mouth at bedtime as needed for sleep., Disp: 60 tablet, Rfl: 3  No Known Allergies  Objective:   VITALS: Per patient if applicable, see vitals. GENERAL: Alert, appears well and in no acute distress. HEENT: Atraumatic, conjunctiva clear, no obvious abnormalities on inspection of external nose and ears. NECK: Normal  movements of the head and neck. CARDIOPULMONARY: No increased WOB. Speaking in clear sentences. I:E ratio WNL.  MS: Moves all visible extremities without noticeable abnormality. PSYCH: Pleasant and cooperative, well-groomed. Speech normal rate and rhythm. Affect is appropriate. Insight and judgement are appropriate. Attention is focused, linear, and appropriate.  NEURO: CN grossly intact. Oriented as arrived to appointment on time with no prompting. Moves both UE equally.  SKIN: No obvious lesions, wounds, erythema, or cyanosis noted  on face or hands.  Assessment and Plan:   [name] was seen today for follow-up.  Diagnoses and all orders for this visit:  HTN, goal below 140/80 -     lisinopril-hydrochlorothiazide (ZESTORETIC) 10-12.5 MG tablet; Take 1 tablet by mouth daily. START WITH 1/2 BY MOUTH DAILY X 2 WEEKS.  Educated About Covid-19 Virus Infection  Alcohol dependence, daily use ([place])  Nicotine use disorder  Tobacco Cessation Counseling:   Smoking cessation counseling was provided.  Discussed alcohol use and risks.   . Reviewed expectations re: course of current medical issues. . Discussed self-management of symptoms. . Outlined signs and symptoms indicating need for more acute intervention. . Patient verbalized understanding and all questions were answered. Marland Kitchen Health Maintenance issues including appropriate healthy diet, exercise, and smoking avoidance were discussed with patient. . See orders for this visit as documented in the electronic medical record.  William Deutscher, DO 03/08/2019

## 2019-03-08 ENCOUNTER — Other Ambulatory Visit: Payer: Self-pay

## 2019-03-08 ENCOUNTER — Encounter: Payer: Self-pay | Admitting: Family Medicine

## 2019-03-08 ENCOUNTER — Ambulatory Visit (INDEPENDENT_AMBULATORY_CARE_PROVIDER_SITE_OTHER): Payer: Managed Care, Other (non HMO) | Admitting: Family Medicine

## 2019-03-08 VITALS — Ht 68.5 in | Wt 225.0 lb

## 2019-03-08 DIAGNOSIS — I10 Essential (primary) hypertension: Secondary | ICD-10-CM

## 2019-03-08 DIAGNOSIS — Z7189 Other specified counseling: Secondary | ICD-10-CM

## 2019-03-08 DIAGNOSIS — F17200 Nicotine dependence, unspecified, uncomplicated: Secondary | ICD-10-CM

## 2019-03-08 DIAGNOSIS — F1020 Alcohol dependence, uncomplicated: Secondary | ICD-10-CM

## 2019-03-08 MED ORDER — LISINOPRIL-HYDROCHLOROTHIAZIDE 10-12.5 MG PO TABS
1.0000 | ORAL_TABLET | Freq: Every day | ORAL | 1 refills | Status: DC
Start: 2019-03-08 — End: 2019-12-29

## 2019-12-29 ENCOUNTER — Other Ambulatory Visit: Payer: Self-pay

## 2019-12-29 DIAGNOSIS — I10 Essential (primary) hypertension: Secondary | ICD-10-CM

## 2019-12-29 MED ORDER — LISINOPRIL-HYDROCHLOROTHIAZIDE 10-12.5 MG PO TABS: 1.0000 | ORAL_TABLET | Freq: Every day | ORAL | 0 refills | Status: DC

## 2020-03-22 ENCOUNTER — Other Ambulatory Visit: Payer: Self-pay

## 2020-03-23 ENCOUNTER — Encounter: Payer: Self-pay | Admitting: Family Medicine

## 2020-03-23 ENCOUNTER — Ambulatory Visit: Payer: Self-pay | Admitting: Family Medicine

## 2020-03-23 VITALS — BP 144/98 | HR 95 | Temp 97.3°F | Ht 68.0 in | Wt 215.6 lb

## 2020-03-23 DIAGNOSIS — Z720 Tobacco use: Secondary | ICD-10-CM | POA: Insufficient documentation

## 2020-03-23 DIAGNOSIS — H6120 Impacted cerumen, unspecified ear: Secondary | ICD-10-CM

## 2020-03-23 DIAGNOSIS — H6123 Impacted cerumen, bilateral: Secondary | ICD-10-CM | POA: Insufficient documentation

## 2020-03-23 DIAGNOSIS — M25511 Pain in right shoulder: Secondary | ICD-10-CM

## 2020-03-23 DIAGNOSIS — I10 Essential (primary) hypertension: Secondary | ICD-10-CM

## 2020-03-23 LAB — CBC
HCT: 45.2 % (ref 39.0–52.0)
Hemoglobin: 15.4 g/dL (ref 13.0–17.0)
MCHC: 34.1 g/dL (ref 30.0–36.0)
MCV: 99.1 fl (ref 78.0–100.0)
Platelets: 247 10*3/uL (ref 150.0–400.0)
RBC: 4.56 Mil/uL (ref 4.22–5.81)
RDW: 13.4 % (ref 11.5–15.5)
WBC: 9.6 10*3/uL (ref 4.0–10.5)

## 2020-03-23 LAB — BASIC METABOLIC PANEL
BUN: 14 mg/dL (ref 6–23)
CO2: 29 mEq/L (ref 19–32)
Calcium: 9.6 mg/dL (ref 8.4–10.5)
Chloride: 105 mEq/L (ref 96–112)
Creatinine, Ser: 0.86 mg/dL (ref 0.40–1.50)
GFR: 90.04 mL/min (ref >60.00)
Glucose, Bld: 99 mg/dL (ref 70–99)
Potassium: 4.5 mEq/L (ref 3.5–5.1)
Sodium: 140 mEq/L (ref 135–145)

## 2020-03-23 MED ORDER — EAR WAX CLEANSING 6.5 % OT KIT: PACK | OTIC | 1 refills | Status: DC

## 2020-03-23 MED ORDER — LIDOCAINE 5 % EX PTCH: MEDICATED_PATCH | CUTANEOUS | 1 refills | Status: DC

## 2020-03-23 MED ORDER — LISINOPRIL-HYDROCHLOROTHIAZIDE 10-12.5 MG PO TABS: 1.0000 | ORAL_TABLET | Freq: Every day | ORAL | 0 refills | Status: DC

## 2020-03-23 NOTE — Progress Notes (Signed)
New Patient Office Visit  Subjective:  Patient ID: William Pitts, male    DOB: [birth year]  Age: 64 y.o. MRN: [id number]  CC:  Chief Complaint  Patient presents with  . Establish Care    new patient, concerns about BP eleveated pt states that he can not hear out of right ear.     HPI [name] presents for follow-up of his hypertension.  Lost to follow-up for some time and has been out of his Zestoretic.  Says that the medicine agreed with him and it is worked well for him in the past.  He has been taking garlic 3 times daily to treat his blood pressure.  He has averaged smoking a pack a day for the last 50 years.  He is not interested in quitting at this time.  He drinks wine on a daily basis.  He had developed depression 3 years ago after he lost his son.  At this point in time he feels as though he is doing better and denies lingering depression.  He has a history of right shoulder pain that is responded well to as needed usage of lidocaine patches.  He has retired and stays busy helping his son. Past Medical History:  Diagnosis Date  . HTN, goal below 140/80 01/08/2017  . Nicotine use disorder 01/08/2017  . Obesity (BMI 30-39.9) 01/08/2017  . PTSD (post-traumatic stress disorder)   . 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  No family history on file.  Social History   Socioeconomic History  . Marital status: Married    Spouse name: Not on file  . Number of children: Not on file  . Years of education: Not on file  . Highest education level: Not on file  Occupational History  . Not on file  Tobacco Use  . Smoking status: Former Smoker    Packs/day: 1.00    Types: Cigarettes    Quit date: 02/02/2018    Years since quitting: 2.1  . Smokeless tobacco: Never Used  . Tobacco comment: vapes with nicotene now  Substance and Sexual Activity  . Alcohol use: Yes    Comment: daily wine  . Drug use: Yes    Types: Marijuana  . Sexual activity: Not on  file  Other Topics Concern  . Not on file  Social History Narrative   From [place]. Has not seen a PCP in 19 years per patient. Smoker. Son committed suicide "over a girl" last month. 01/08/2017   Social Determinants of Health   Financial Resource Strain:   . Difficulty of Paying Living Expenses:   Food Insecurity:   . Worried About Charity fundraiser in the Last Year:   . Arboriculturist in the Last Year:   Transportation Needs:   . Film/video editor (Medical):   Marland Kitchen Lack of Transportation (Non-Medical):   Physical Activity:   . Days of Exercise per Week:   . Minutes of Exercise per Session:   Stress:   . Feeling of Stress :   Social Connections:   . Frequency of Communication with Friends and Family:   . Frequency of Social Gatherings with Friends and Family:   . Attends Religious Services:   . Active Member of Clubs or Organizations:   . Attends Archivist Meetings:   Marland Kitchen Marital Status:   Intimate Partner Violence:   . Fear of Current or Ex-Partner:   . Emotionally Abused:   .  Physically Abused:   . Sexually Abused:     ROS Review of Systems  Constitutional: Negative.   HENT: Negative.   Eyes: Negative for photophobia and visual disturbance.  Respiratory: Negative.   Cardiovascular: Negative.   Gastrointestinal: Negative.   Endocrine: Negative for polyphagia and polyuria.  Genitourinary: Negative.   Musculoskeletal: Positive for arthralgias. Negative for gait problem and joint swelling.  Skin: Negative for pallor and rash.  Allergic/Immunologic: Negative for immunocompromised state.  Neurological: Negative for light-headedness and headaches.  Hematological: Does not bruise/bleed easily.  Psychiatric/Behavioral: Negative.    Depression screen [facility] 2/9 03/23/2020 03/23/2020 02/04/2018  Decreased Interest 0 0 3  Down, Depressed, Hopeless 0 0 1  PHQ - 2 Score 0 0 4  Altered sleeping 1 - 0  Tired, decreased energy 1 - 1  Change in appetite 0 - 0  Feeling  bad or failure about yourself  0 - 1  Trouble concentrating 0 - 0  Moving slowly or fidgety/restless 0 - 0  Suicidal thoughts 0 - 0  PHQ-9 Score 2 - 6  Difficult doing work/chores Not difficult at all - Not difficult at all    Objective:   Today's Vitals: BP (!) 144/98   Pulse 95   Temp (!) 97.3 F (36.3 C) (Tympanic)   Ht _0  (1.727 m)   Wt 215 lb 9.6 oz (97.8 kg)   SpO2 95%   BMI 32.78 kg/m   Physical Exam Vitals and nursing note reviewed.  Constitutional:      General: He is not in acute distress.    Appearance: Normal appearance. He is not ill-appearing, toxic-appearing or diaphoretic.  HENT:     Head: Normocephalic and atraumatic.     Right Ear: Tympanic membrane, ear canal and external ear normal.     Left Ear: Tympanic membrane and ear canal normal.     Ears:   Eyes:     General: No scleral icterus.       Right eye: No discharge.        Left eye: No discharge.     Extraocular Movements: Extraocular movements intact.     Conjunctiva/sclera: Conjunctivae normal.     Pupils: Pupils are equal, round, and reactive to light.  Cardiovascular:     Rate and Rhythm: Normal rate and regular rhythm.  Pulmonary:     Effort: Pulmonary effort is normal.     Breath sounds: Normal breath sounds.  Musculoskeletal:     Cervical back: No rigidity or tenderness.  Lymphadenopathy:     Cervical: No cervical adenopathy.  Skin:    General: Skin is warm.  Neurological:     Mental Status: He is alert and oriented to person, place, and time.  Psychiatric:        Mood and Affect: Mood normal.        Behavior: Behavior normal.     Assessment & Plan:   Problem List Items Addressed This Visit      Cardiovascular and Mediastinum   HTN, goal below 140/80 - Primary   Relevant Medications   lisinopril-hydrochlorothiazide (ZESTORETIC) 10-12.5 MG tablet     Other   Tobacco abuse   Relevant Orders   Ambulatory Referral for Lung Cancer Scre   Right shoulder pain   Relevant  Medications   lidocaine (LIDODERM) 5 %   Cerumen in auditory canal on examination   Relevant Medications   Carbamide Peroxide-Saline (EAR WAX CLEANSING) 6.5 % KIT      Outpatient Encounter Medications  as of 03/23/2020  Medication Sig  . aspirin EC 81 MG tablet Take 81 mg by mouth daily.  . Multiple Vitamins-Minerals (MULTIVITAMIN WITH MINERALS) tablet Take 1 tablet by mouth daily.  . Omega-3 Fatty Acids (FISH OIL PO) Take 1 capsule by mouth daily.  Garlan Fillers Peroxide-Saline (EAR WAX CLEANSING) 6.5 % KIT Follow directions on kit.  Marland Kitchen lidocaine (LIDODERM) 5 % May apply one patch daily for 12 hours only. Remove and discard.  Marland Kitchen lisinopril-hydrochlorothiazide (ZESTORETIC) 10-12.5 MG tablet Take 1 tablet by mouth daily. 1 tab daily .  . [DISCONTINUED] lisinopril-hydrochlorothiazide (ZESTORETIC) 10-12.5 MG tablet Take 1 tablet by mouth daily. 1 tab daily . (Patient not taking: Reported on 03/23/2020)   No facility-administered encounter medications on file as of 03/23/2020.    Follow-up: Return in about 3 months (around 06/23/2020), or for physical exam.   Libby Maw, MD

## 2020-03-28 ENCOUNTER — Other Ambulatory Visit: Payer: Self-pay

## 2020-03-28 ENCOUNTER — Ambulatory Visit: Payer: Self-pay | Admitting: Family Medicine

## 2020-03-28 ENCOUNTER — Encounter: Payer: Self-pay | Admitting: Family Medicine

## 2020-03-28 VITALS — BP 132/84 | HR 91 | Temp 97.0°F | Ht 68.0 in | Wt 212.8 lb

## 2020-03-28 DIAGNOSIS — I10 Essential (primary) hypertension: Secondary | ICD-10-CM

## 2020-03-28 DIAGNOSIS — H6120 Impacted cerumen, unspecified ear: Secondary | ICD-10-CM

## 2020-03-28 NOTE — Patient Instructions (Addendum)
Earwax Buildup, Adult The ears produce a substance called earwax that helps keep bacteria out of the ear and protects the skin in the ear canal. Occasionally, earwax can build up in the ear and cause discomfort or hearing loss. What increases the risk? This condition is more likely to develop in people who:  Are male.  Are elderly.  Naturally produce more earwax.  Clean their ears often with cotton swabs.  Use earplugs often.  Use in-ear headphones often.  Wear hearing aids.  Have narrow ear canals.  Have earwax that is overly thick or sticky.  Have eczema.  Are dehydrated.  Have excess hair in the ear canal. What are the signs or symptoms? Symptoms of this condition include:  Reduced or muffled hearing.  A feeling of fullness in the ear or feeling that the ear is plugged.  Fluid coming from the ear.  Ear pain.  Ear itch.  Ringing in the ear.  Coughing.  An obvious piece of earwax that can be seen inside the ear canal. How is this diagnosed? This condition may be diagnosed based on:  Your symptoms.  Your medical history.  An ear exam. During the exam, your health care provider will look into your ear with an instrument called an otoscope. You may have tests, including a hearing test. How is this treated? This condition may be treated by:  Using ear drops to soften the earwax.  Having the earwax removed by a health care provider. The health care provider may: ? Flush the ear with water. ? Use an instrument that has a loop on the end (curette). ? Use a suction device.  Surgery to remove the wax buildup. This may be done in severe cases. Follow these instructions at home:   Take over-the-counter and prescription medicines only as told by your health care provider.  Do not put any objects, including cotton swabs, into your ear. You can clean the opening of your ear canal with a washcloth or facial tissue.  Follow instructions from your health care  provider about cleaning your ears. Do not over-clean your ears.  Drink enough fluid to keep your urine clear or pale yellow. This will help to thin the earwax.  Keep all follow-up visits as told by your health care provider. If earwax builds up in your ears often or if you use hearing aids, consider seeing your health care provider for routine, preventive ear cleanings. Ask your health care provider how often you should schedule your cleanings.  If you have hearing aids, clean them according to instructions from the manufacturer and your health care provider. Contact a health care provider if:  You have ear pain.  You develop a fever.  You have blood, pus, or other fluid coming from your ear.  You have hearing loss.  You have ringing in your ears that does not go away.  Your symptoms do not improve with treatment.  You feel like the room is spinning (vertigo). Summary  Earwax can build up in the ear and cause discomfort or hearing loss.  The most common symptoms of this condition include reduced or muffled hearing and a feeling of fullness in the ear or feeling that the ear is plugged.  This condition may be diagnosed based on your symptoms, your medical history, and an ear exam.  This condition may be treated by using ear drops to soften the earwax or by having the earwax removed by a health care provider.  Do not put any   objects, including cotton swabs, into your ear. You can clean the opening of your ear canal with a washcloth or facial tissue. This information is not intended to replace advice given to you by your health care provider. Make sure you discuss any questions you have with your health care provider. Document Revised: 10/17/2017 Document Reviewed: 01/15/2017 Elsevier Patient Education  2020 [employer]  

## 2020-03-28 NOTE — Progress Notes (Signed)
Established Patient Office Visit  Subjective:  Patient ID: William Pitts, male    DOB: May 03, 1956  Age: 64 y.o. MRN: [id number]  CC:  Chief Complaint  Patient presents with  . Cerumen Impaction    pt here for ear irrigation    HPI William Pitts presents for irrigation of his ear. Used ear drops a month ago. Uses Q tips some. Denies difficulty hearing or ear pain.   Past Medical History:  Diagnosis Date  . HTN, goal below 140/80 01/08/2017  . Nicotine use disorder 01/08/2017  . Obesity (BMI 30-39.9) 01/08/2017  . PTSD (post-traumatic stress disorder)   . 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  No family history on file.  Social History   Socioeconomic History  . Marital status: Married    Spouse name: Not on file  . Number of children: Not on file  . Years of education: Not on file  . Highest education level: Not on file  Occupational History  . Not on file  Tobacco Use  . Smoking status: Former Smoker    Packs/day: 1.00    Types: Cigarettes    Quit date: 02/02/2018    Years since quitting: 2.1  . Smokeless tobacco: Never Used  . Tobacco comment: vapes with nicotene now  Substance and Sexual Activity  . Alcohol use: Yes    Comment: daily wine  . Drug use: Yes    Types: Marijuana  . Sexual activity: Not on file  Other Topics Concern  . Not on file  Social History Narrative   From [place]. Has not seen a PCP in 19 years per patient. Smoker. Son committed suicide "over a girl" last month. 01/08/2017   Social Determinants of Health   Financial Resource Strain:   . Difficulty of Paying Living Expenses:   Food Insecurity:   . Worried About Charity fundraiser in the Last Year:   . Arboriculturist in the Last Year:   Transportation Needs:   . Film/video editor (Medical):   Marland Kitchen Lack of Transportation (Non-Medical):   Physical Activity:   . Days of Exercise per Week:   . Minutes of Exercise per Session:   Stress:   . Feeling of  Stress :   Social Connections:   . Frequency of Communication with Friends and Family:   . Frequency of Social Gatherings with Friends and Family:   . Attends Religious Services:   . Active Member of Clubs or Organizations:   . Attends Archivist Meetings:   Marland Kitchen Marital Status:   Intimate Partner Violence:   . Fear of Current or Ex-Partner:   . Emotionally Abused:   Marland Kitchen Physically Abused:   . Sexually Abused:     Outpatient Medications Prior to Visit  Medication Sig Dispense Refill  . aspirin EC 81 MG tablet Take 81 mg by mouth daily.    Garlan Fillers Peroxide-Saline (EAR WAX CLEANSING) 6.5 % KIT Follow directions on kit. 1 kit 1  . lidocaine (LIDODERM) 5 % May apply one patch daily for 12 hours only. Remove and discard. 30 patch 1  . lisinopril-hydrochlorothiazide (ZESTORETIC) 10-12.5 MG tablet Take 1 tablet by mouth daily. 1 tab daily . 90 tablet 0  . Multiple Vitamins-Minerals (MULTIVITAMIN WITH MINERALS) tablet Take 1 tablet by mouth daily.    . Omega-3 Fatty Acids (FISH OIL PO) Take 1 capsule by mouth daily.     No facility-administered medications prior to  visit.    No Known Allergies  ROS Review of Systems  HENT: Negative for ear discharge, ear pain and hearing loss.   Respiratory: Negative.   Cardiovascular: Negative.   Gastrointestinal: Negative.       Objective:    Physical Exam  Constitutional: He is oriented to person, place, and time. He appears well-developed and well-nourished. No distress.  HENT:  Head: Normocephalic and atraumatic.  Right Ear: Tympanic membrane and external ear normal.  Left Ear: Tympanic membrane and external ear normal.  Ears:  Eyes: Right eye exhibits no discharge. Left eye exhibits no discharge. No scleral icterus.  Pulmonary/Chest: Effort normal.  Neurological: He is alert and oriented to person, place, and time.  Skin: Skin is warm and dry. He is not diaphoretic.  Psychiatric: He has a normal mood and affect. His behavior  is normal.   Subjective:    William Pitts is a 64 y.o. male whom I am asked to see for evaluation of discharge in both ears for the past many months. There is a prior history of cerumen impaction. The patient has not been using ear drops to loosen wax immediately prior to this visit. The patient denies ear pain.  The patient's history has been marked as reviewed and updated as appropriate.  Review of Systems Pertinent items are noted in HPI.    Objective:    Auditory canal(s) of the right ear are partially obstructed with cerumen.   Cerumen was removed using gentle irrigation. Tympanic membranes are intact following the procedure.  Auditory canals are normal.    Assessment:    Cerumen Impaction without otitis externa.    Plan:    1. Care instructions given. 2. Home treatment: debrox ear drops used 2-3 times weekly and avoid Qtips. . 3. Follow-up as needed.  BP 132/84   Pulse 91   Temp (!) 97 F (36.1 C) (Tympanic)   Ht _0  (1.727 m)   Wt 212 lb 12.8 oz (96.5 kg)   SpO2 96%   BMI 32.36 kg/m  Wt Readings from Last 3 Encounters:  03/28/20 212 lb 12.8 oz (96.5 kg)  03/23/20 215 lb 9.6 oz (97.8 kg)  03/08/19 225 lb (102.1 kg)     Health Maintenance Due  Topic Date Due  . Hepatitis C Screening  Never done  . COVID-19 Vaccine (1) Never done  . TETANUS/TDAP  Never done  . COLONOSCOPY  Never done    There are no preventive care reminders to display for this patient.  No results found for: TSH Lab Results  Component Value Date   WBC 9.6 03/23/2020   HGB 15.4 03/23/2020   HCT 45.2 03/23/2020   MCV 99.1 03/23/2020   PLT 247.0 03/23/2020   Lab Results  Component Value Date   NA 140 03/23/2020   K 4.5 03/23/2020   CO2 29 03/23/2020   GLUCOSE 99 03/23/2020   BUN 14 03/23/2020   CREATININE 0.86 03/23/2020   BILITOT 0.7 02/04/2018   ALKPHOS 59 02/04/2018   AST 28 02/04/2018   ALT 26 02/04/2018   PROT 7.8 02/04/2018   ALBUMIN 4.8 02/04/2018   CALCIUM 9.6  03/23/2020   ANIONGAP 7 12/30/2016   GFR 90.04 03/23/2020   Lab Results  Component Value Date   CHOL 228 (H) 02/04/2018   Lab Results  Component Value Date   HDL 94.10 02/04/2018   Lab Results  Component Value Date   LDLCALC 119 (H) 02/04/2018   Lab Results  Component  Value Date   TRIG 74.0 02/04/2018   Lab Results  Component Value Date   CHOLHDL 2 02/04/2018   No results found for: HGBA1C    Assessment & Plan:   Problem List Items Addressed This Visit      Cardiovascular and Mediastinum   Essential hypertension     Other   Cerumen in auditory canal on examination - Primary      No orders of the defined types were placed in this encounter.   Follow-up: No follow-ups on file.  BP improved. Stressed regular use of drops and avoidance of Qtips.   Libby Maw, MD

## 2020-06-24 ENCOUNTER — Other Ambulatory Visit: Payer: Self-pay | Admitting: Family Medicine

## 2020-06-24 DIAGNOSIS — I10 Essential (primary) hypertension: Secondary | ICD-10-CM

## 2020-06-30 ENCOUNTER — Other Ambulatory Visit: Payer: Self-pay | Admitting: Family Medicine

## 2020-06-30 DIAGNOSIS — I10 Essential (primary) hypertension: Secondary | ICD-10-CM

## 2020-08-08 NOTE — Telephone Encounter (Signed)
Appointment scheduled.

## 2020-08-21 ENCOUNTER — Other Ambulatory Visit: Payer: Self-pay

## 2020-08-22 ENCOUNTER — Ambulatory Visit: Payer: Self-pay | Admitting: Family Medicine

## 2020-09-15 ENCOUNTER — Other Ambulatory Visit: Payer: Self-pay

## 2020-09-15 ENCOUNTER — Encounter: Payer: Self-pay | Admitting: Family Medicine

## 2020-09-15 ENCOUNTER — Ambulatory Visit (INDEPENDENT_AMBULATORY_CARE_PROVIDER_SITE_OTHER): Payer: Self-pay | Admitting: Family Medicine

## 2020-09-15 VITALS — BP 144/92 | HR 77 | Temp 97.7°F | Ht 68.0 in | Wt 212.4 lb

## 2020-09-15 DIAGNOSIS — F341 Dysthymic disorder: Secondary | ICD-10-CM | POA: Insufficient documentation

## 2020-09-15 DIAGNOSIS — I10 Essential (primary) hypertension: Secondary | ICD-10-CM

## 2020-09-15 DIAGNOSIS — Z720 Tobacco use: Secondary | ICD-10-CM

## 2020-09-15 DIAGNOSIS — Z0000 Encounter for general adult medical examination without abnormal findings: Secondary | ICD-10-CM

## 2020-09-15 DIAGNOSIS — Z7189 Other specified counseling: Secondary | ICD-10-CM | POA: Insufficient documentation

## 2020-09-15 DIAGNOSIS — M25511 Pain in right shoulder: Secondary | ICD-10-CM

## 2020-09-15 MED ORDER — LISINOPRIL-HYDROCHLOROTHIAZIDE 10-12.5 MG PO TABS: 1.0000 | ORAL_TABLET | Freq: Every day | ORAL | 1 refills | Status: DC

## 2020-09-15 MED ORDER — BUPROPION HCL ER (SR) 100 MG PO TB12: 100.0000 mg | ORAL_TABLET | Freq: Two times a day (BID) | ORAL | 2 refills | Status: DC

## 2020-09-15 MED ORDER — LIDOCAINE 5 % EX PTCH: MEDICATED_PATCH | CUTANEOUS | 1 refills | Status: DC

## 2020-09-15 NOTE — Progress Notes (Signed)
Established Patient Office Visit  Subjective:  Patient ID: William Pitts, male    DOB: Oct 07, 1956  Age: 64 y.o. MRN: [id number]  CC:  Chief Complaint  Patient presents with  . Follow-up    HPI William Pitts presents for follow-up of his hypertension.  Out of medicine a couple months now.  Blood pressure was in the 120s over 80s when he was taking it.  Requests a refill on the lidocaine patches for his left shoulder pain.  He is constantly having under hot water heaters to relate them.  This tends to set it off.  Continues to smoke.  Drinks 1 glass of wine daily.  Continues to feel sadness about the loss of his son.  Has never thought about hurting himself but admits that he would be okay if he passed away.  This is his rationale for not stopping smoking.   Past Medical History:  Diagnosis Date  . HTN, goal below 140/80 01/08/2017  . Nicotine use disorder 01/08/2017  . Obesity (BMI 30-39.9) 01/08/2017  . PTSD (post-traumatic stress disorder)   . 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 No pertinent surgical history.  History reviewed. No pertinent family history.  Social History   Socioeconomic History  . Marital status: Married    Spouse name: Not on file  . Number of children: Not on file  . Years of education: Not on file  . Highest education level: Not on file  Occupational History  . Not on file  Tobacco Use  . Smoking status: Former Smoker    Packs/day: 1.00    Types: Cigarettes    Quit date: 02/02/2018    Years since quitting: 2.6  . Smokeless tobacco: Never Used  . Tobacco comment: vapes with nicotene now  Vaping Use  . Vaping Use: Former  Substance and Sexual Activity  . Alcohol use: Yes    Comment: daily wine  . Drug use: Yes    Types: Marijuana  . Sexual activity: Not on file  Other Topics Concern  . Not on file  Social History Narrative   From [place]. Has not seen a PCP in 19 years per patient. Smoker. Son committed suicide "over a girl" last  month. 01/08/2017   Social Determinants of Health   Financial Resource Strain:   . Difficulty of Paying Living Expenses: Not on file  Food Insecurity:   . Worried About Charity fundraiser in the Last Year: Not on file  . Ran Out of Food in the Last Year: Not on file  Transportation Needs:   . Lack of Transportation (Medical): Not on file  . Lack of Transportation (Non-Medical): Not on file  Physical Activity:   . Days of Exercise per Week: Not on file  . Minutes of Exercise per Session: Not on file  Stress:   . Feeling of Stress : Not on file  Social Connections:   . Frequency of Communication with Friends and Family: Not on file  . Frequency of Social Gatherings with Friends and Family: Not on file  . Attends Religious Services: Not on file  . Active Member of Clubs or Organizations: Not on file  . Attends Archivist Meetings: Not on file  . Marital Status: Not on file  Intimate Partner Violence:   . Fear of Current or Ex-Partner: Not on file  . Emotionally Abused: Not on file  . Physically Abused: Not on file  . Sexually Abused: Not on file  Outpatient Medications Prior to Visit  Medication Sig Dispense Refill  . aspirin EC 81 MG tablet Take 81 mg by mouth daily.    Garlan Fillers Peroxide-Saline (EAR WAX CLEANSING) 6.5 % KIT Follow directions on kit. 1 kit 1  . Multiple Vitamins-Minerals (MULTIVITAMIN WITH MINERALS) tablet Take 1 tablet by mouth daily.    . Omega-3 Fatty Acids (FISH OIL PO) Take 1 capsule by mouth daily.    Marland Kitchen lidocaine (LIDODERM) 5 % May apply one patch daily for 12 hours only. Remove and discard. 30 patch 1  . lisinopril-hydrochlorothiazide (ZESTORETIC) 10-12.5 MG tablet TAKE 1 TABLET BY MOUTH DAILY 90 tablet 0   No facility-administered medications prior to visit.    No Known Allergies  ROS Review of Systems  Constitutional: Negative.   HENT: Negative.   Eyes: Negative for photophobia and visual disturbance.  Respiratory: Negative.     Cardiovascular: Negative.   Gastrointestinal: Negative.   Endocrine: Negative for polyphagia and polyuria.  Genitourinary: Negative.   Musculoskeletal: Negative for gait problem and joint swelling.  Skin: Negative for pallor and rash.  Allergic/Immunologic: Negative for immunocompromised state.  Hematological: Does not bruise/bleed easily.  Psychiatric/Behavioral: Positive for dysphoric mood. The patient is not nervous/anxious.        Depression screen [facility] 2/9 09/15/2020 03/23/2020 03/23/2020  Decreased Interest 1 0 0  Down, Depressed, Hopeless 1 0 0  PHQ - 2 Score 2 0 0  Altered sleeping 0 1 -  Tired, decreased energy 1 1 -  Change in appetite 0 0 -  Feeling bad or failure about yourself  1 0 -  Trouble concentrating 1 0 -  Moving slowly or fidgety/restless 0 0 -  Suicidal thoughts 0 0 -  PHQ-9 Score 5 2 -  Difficult doing work/chores Somewhat difficult Not difficult at all -    Objective:    Physical Exam Vitals and nursing note reviewed.  Constitutional:      General: He is not in acute distress.    Appearance: Normal appearance. He is normal weight. He is not ill-appearing, toxic-appearing or diaphoretic.  HENT:     Head: Normocephalic and atraumatic.     Right Ear: Tympanic membrane, ear canal and external ear normal.     Left Ear: Tympanic membrane, ear canal and external ear normal.  Eyes:     General: No scleral icterus.       Right eye: No discharge.        Left eye: No discharge.     Conjunctiva/sclera: Conjunctivae normal.     Pupils: Pupils are equal, round, and reactive to light.  Cardiovascular:     Rate and Rhythm: Normal rate.  Pulmonary:     Effort: Pulmonary effort is normal. No respiratory distress.     Breath sounds: Normal breath sounds. No wheezing or rhonchi.  Abdominal:     General: Bowel sounds are normal.  Musculoskeletal:     Cervical back: Neck supple. No rigidity or tenderness.     Right lower leg: No edema.     Left lower leg: No  edema.  Lymphadenopathy:     Cervical: No cervical adenopathy.  Skin:    General: Skin is warm and dry.  Neurological:     Mental Status: He is alert and oriented to person, place, and time.  Psychiatric:        Mood and Affect: Mood normal.        Behavior: Behavior normal.     BP (!) 144/92 (BP  Location: Left Arm, Patient Position: Sitting, Cuff Size: Large)   Pulse 77   Temp 97.7 F (36.5 C) (Temporal)   Ht 5' 8" (1.727 m)   Wt 212 lb 6.4 oz (96.3 kg)   SpO2 97%   BMI 32.30 kg/m  Wt Readings from Last 3 Encounters:  09/15/20 212 lb 6.4 oz (96.3 kg)  03/28/20 212 lb 12.8 oz (96.5 kg)  03/23/20 215 lb 9.6 oz (97.8 kg)     Health Maintenance Due  Topic Date Due  . Hepatitis C Screening  Never done  . TETANUS/TDAP  Never done  . COLONOSCOPY  Never done  . INFLUENZA VACCINE  Never done    There are no preventive care reminders to display for this patient.  No results found for: TSH Lab Results  Component Value Date   WBC 9.6 03/23/2020   HGB 15.4 03/23/2020   HCT 45.2 03/23/2020   MCV 99.1 03/23/2020   PLT 247.0 03/23/2020   Lab Results  Component Value Date   NA 140 03/23/2020   K 4.5 03/23/2020   CO2 29 03/23/2020   GLUCOSE 99 03/23/2020   BUN 14 03/23/2020   CREATININE 0.86 03/23/2020   BILITOT 0.7 02/04/2018   ALKPHOS 59 02/04/2018   AST 28 02/04/2018   ALT 26 02/04/2018   PROT 7.8 02/04/2018   ALBUMIN 4.8 02/04/2018   CALCIUM 9.6 03/23/2020   ANIONGAP 7 12/30/2016   GFR 90.04 03/23/2020   Lab Results  Component Value Date   CHOL 228 (H) 02/04/2018   Lab Results  Component Value Date   HDL 94.10 02/04/2018   Lab Results  Component Value Date   LDLCALC 119 (H) 02/04/2018   Lab Results  Component Value Date   TRIG 74.0 02/04/2018   Lab Results  Component Value Date   CHOLHDL 2 02/04/2018   No results found for: HGBA1C    Assessment & Plan:   Problem List Items Addressed This Visit      Cardiovascular and Mediastinum    Essential hypertension - Primary   Relevant Medications   lisinopril-hydrochlorothiazide (ZESTORETIC) 10-12.5 MG tablet   Other Relevant Orders   CBC   Comprehensive metabolic panel   Urinalysis, Routine w reflex microscopic     Other   Tobacco abuse   Relevant Medications   buPROPion (WELLBUTRIN SR) 100 MG 12 hr tablet   Right shoulder pain   Relevant Medications   lidocaine (LIDODERM) 5 %   Dysthymia   Relevant Medications   buPROPion (WELLBUTRIN SR) 100 MG 12 hr tablet    Other Visit Diagnoses    HTN, goal below 140/80       Relevant Medications   lisinopril-hydrochlorothiazide (ZESTORETIC) 10-12.5 MG tablet   Healthcare maintenance       Relevant Orders   LDL cholesterol, direct   PSA      Meds ordered this encounter  Medications  . lidocaine (LIDODERM) 5 %    Sig: May apply one patch daily for 12 hours only. Remove and discard.    Dispense:  30 patch    Refill:  1  . lisinopril-hydrochlorothiazide (ZESTORETIC) 10-12.5 MG tablet    Sig: Take 1 tablet by mouth daily.    Dispense:  90 tablet    Refill:  1  . buPROPion (WELLBUTRIN SR) 100 MG 12 hr tablet    Sig: Take 1 tablet (100 mg total) by mouth 2 (two) times daily.    Dispense:  60 tablet    Refill:  2    Follow-up: Return in about 5 weeks (around 10/20/2020).  Asked him to stop smoking.  He asked me how.  Told him that I have a pill may help him stop smoking and help elevate his mood and relieve some of his sadness.  He agreed to try it.  Stressed the importance of follow-up for running out of blood pressure medicine.  Discussed trying a shoulder injection at some point with a dedicated visit.  Libby Maw, MD

## 2020-09-15 NOTE — Patient Instructions (Signed)
Bupropion sustained-release tablets (Depression/Mood Disorders) What is this medicine? BUPROPION (byoo PROE pee on) is used to treat depression. This medicine may be used for other purposes; ask your health care provider or pharmacist if you have questions. COMMON BRAND NAME(S): Budeprion SR, Wellbutrin SR What should I tell my health care provider before I take this medicine? They need to know if you have any of these conditions:  an eating disorder, such as anorexia or bulimia  bipolar disorder or psychosis  diabetes or high blood sugar, treated with medication  glaucoma  head injury or brain tumor  heart disease, previous heart attack, or irregular heart beat  high blood pressure  kidney or liver disease  seizures  suicidal thoughts or a previous suicide attempt  Tourette's syndrome  weight loss  an unusual or allergic reaction to bupropion, other medicines, foods, dyes, or preservatives  breast-feeding  pregnant or trying to become pregnant How should I use this medicine? Take this medicine by mouth with a glass of water. Follow the directions on the prescription label. You can take it with or without food. If it upsets your stomach, take it with food. Do not cut, crush or chew this medicine. Take your medicine at regular intervals. If you take this medicine more than once a day, take your second dose at least 8 hours after you take your first dose. To limit difficulty in sleeping, avoid taking this medicine at bedtime. Do not take your medicine more often than directed. Do not stop taking this medicine suddenly except upon the advice of your doctor. Stopping this medicine too quickly may cause serious side effects or your condition may worsen. A special MedGuide will be given to you by the pharmacist with each prescription and refill. Be sure to read this information carefully each time. Talk to your pediatrician regarding the use of this medicine in children. Special  care may be needed. Overdosage: If you think you have taken too much of this medicine contact a poison control center or emergency room at once. NOTE: This medicine is only for you. Do not share this medicine with others. What if I miss a dose? If you miss a dose, skip the missed dose and take your next tablet at the regular time. There should be at least 8 hours between doses. Do not take double or extra doses. What may interact with this medicine? Do not take this medicine with any of the following medications:  linezolid  MAOIs like Azilect, Carbex, Eldepryl, Marplan, Nardil, and Parnate  methylene blue (injected into a vein)  other medicines that contain bupropion like Zyban This medicine may also interact with the following medications:  alcohol  certain medicines for anxiety or sleep  certain medicines for blood pressure like metoprolol, propranolol  certain medicines for depression or psychotic disturbances  certain medicines for HIV or AIDS like efavirenz, lopinavir, nelfinavir, ritonavir  certain medicines for irregular heart beat like propafenone, flecainide  certain medicines for Parkinson's disease like amantadine, levodopa  certain medicines for seizures like carbamazepine, phenytoin, phenobarbital  cimetidine  clopidogrel  cyclophosphamide  digoxin  furazolidone  isoniazid  nicotine  orphenadrine  procarbazine  steroid medicines like prednisone or cortisone  stimulant medicines for attention disorders, weight loss, or to stay awake  tamoxifen  theophylline  thiotepa  ticlopidine  tramadol  warfarin This list may not describe all possible interactions. Give your health care provider a list of all the medicines, herbs, non-prescription drugs, or dietary supplements you use. Also  tell them if you smoke, drink alcohol, or use illegal drugs. Some items may interact with your medicine. What should I watch for while using this medicine? Tell  your doctor if your symptoms do not get better or if they get worse. Visit your doctor or healthcare provider for regular checks on your progress. Because it may take several weeks to see the full effects of this medicine, it is important to continue your treatment as prescribed by your doctor. This medicine may cause serious skin reactions. They can happen weeks to months after starting the medicine. Contact your healthcare provider right away if you notice fevers or flu-like symptoms with a rash. The rash may be red or purple and then turn into blisters or peeling of the skin. Or, you might notice a red rash with swelling of the face, lips or lymph nodes in your neck or under your arms. Patients and their families should watch out for new or worsening thoughts of suicide or depression. Also watch out for sudden changes in feelings such as feeling anxious, agitated, panicky, irritable, hostile, aggressive, impulsive, severely restless, overly excited and hyperactive, or not being able to sleep. If this happens, especially at the beginning of treatment or after a change in dose, call your healthcare provider. Avoid alcoholic drinks while taking this medicine. Drinking excessive alcoholic beverages, using sleeping or anxiety medicines, or quickly stopping the use of these agents while taking this medicine may increase your risk for a seizure. Do not drive or use heavy machinery until you know how this medicine affects you. This medicine can impair your ability to perform these tasks. Do not take this medicine close to bedtime. It may prevent you from sleeping. Your mouth may get dry. Chewing sugarless gum or sucking hard candy, and drinking plenty of water may help. Contact your doctor if the problem does not go away or is severe. What side effects may I notice from receiving this medicine? Side effects that you should report to your doctor or health care professional as soon as possible:  allergic  reactions like skin rash, itching or hives, swelling of the face, lips, or tongue  breathing problems  changes in vision  confusion  elevated mood, decreased need for sleep, racing thoughts, impulsive behavior  fast or irregular heartbeat  hallucinations, loss of contact with reality  increased blood pressure  rash, fever, and swollen lymph nodes  redness, blistering, peeling or loosening of the skin, including inside the mouth  seizures  suicidal thoughts or other mood changes  unusually weak or tired  vomiting Side effects that usually do not require medical attention (report to your doctor or health care professional if they continue or are bothersome):  constipation  headache  loss of appetite  nausea  tremors  weight loss This list may not describe all possible side effects. Call your doctor for medical advice about side effects. You may report side effects to FDA at 1-800-FDA-1088. Where should I keep my medicine? Keep out of the reach of children. Store at room temperature between 20 and 25 degrees C (68 and 77 degrees F), away from direct sunlight and moisture. Keep tightly closed. Throw away any unused medicine after the expiration date. NOTE: This sheet is a summary. It may not cover all possible information. If you have questions about this medicine, talk to your doctor, pharmacist, or health care provider.  2020 Elsevier/Gold Standard (2019-01-28 13:55:14)  Managing Your Hypertension Hypertension is commonly called high blood pressure. This is  when the force of your blood pressing against the walls of your arteries is too strong. Arteries are blood vessels that carry blood from your heart throughout your body. Hypertension forces the heart to work harder to pump blood, and may cause the arteries to become narrow or stiff. Having untreated or uncontrolled hypertension can cause heart attack, stroke, kidney disease, and other problems. What are blood  pressure readings? A blood pressure reading consists of a higher number over a lower number. Ideally, your blood pressure should be below 120/80. The first ("top") number is called the systolic pressure. It is a measure of the pressure in your arteries as your heart beats. The second ("bottom") number is called the diastolic pressure. It is a measure of the pressure in your arteries as the heart relaxes. What does my blood pressure reading mean? Blood pressure is classified into four stages. Based on your blood pressure reading, your health care provider may use the following stages to determine what type of treatment you need, if any. Systolic pressure and diastolic pressure are measured in a unit called mm Hg. Normal  Systolic pressure: below 829.  Diastolic pressure: below 80. Elevated  Systolic pressure: 562-130.  Diastolic pressure: below 80. Hypertension stage 1  Systolic pressure: 865-784.  Diastolic pressure: 69-62. Hypertension stage 2  Systolic pressure: 952 or above.  Diastolic pressure: 90 or above. What health risks are associated with hypertension? Managing your hypertension is an important responsibility. Uncontrolled hypertension can lead to:  A heart attack.  A stroke.  A weakened blood vessel (aneurysm).  Heart failure.  Kidney damage.  Eye damage.  Metabolic syndrome.  Memory and concentration problems. What changes can I make to manage my hypertension? Hypertension can be managed by making lifestyle changes and possibly by taking medicines. Your health care provider will help you make a plan to bring your blood pressure within a normal range. Eating and drinking   Eat a diet that is high in fiber and potassium, and low in salt (sodium), added sugar, and fat. An example eating plan is called the DASH (Dietary Approaches to Stop Hypertension) diet. To eat this way: ? Eat plenty of fresh fruits and vegetables. Try to fill half of your plate at each  meal with fruits and vegetables. ? Eat whole grains, such as whole wheat pasta, brown rice, or whole grain bread. Fill about one quarter of your plate with whole grains. ? Eat low-fat diary products. ? Avoid fatty cuts of meat, processed or cured meats, and poultry with skin. Fill about one quarter of your plate with lean proteins such as fish, chicken without skin, beans, eggs, and tofu. ? Avoid premade and processed foods. These tend to be higher in sodium, added sugar, and fat.  Reduce your daily sodium intake. Most people with hypertension should eat less than 1,500 mg of sodium a day.  Limit alcohol intake to no more than 1 drink a day for nonpregnant women and 2 drinks a day for men. One drink equals 12 oz of beer, 5 oz of wine, or 1 oz of hard liquor. Lifestyle  Work with your health care provider to maintain a healthy body weight, or to lose weight. Ask what an ideal weight is for you.  Get at least 30 minutes of exercise that causes your heart to beat faster (aerobic exercise) most days of the week. Activities may include walking, swimming, or biking.  Include exercise to strengthen your muscles (resistance exercise), such as weight lifting, as  part of your weekly exercise routine. Try to do these types of exercises for 30 minutes at least 3 days a week.  Do not use any products that contain nicotine or tobacco, such as cigarettes and e-cigarettes. If you need help quitting, ask your health care provider.  Control any long-term (chronic) conditions you have, such as high cholesterol or diabetes. Monitoring  Monitor your blood pressure at home as told by your health care provider. Your personal target blood pressure may vary depending on your medical conditions, your age, and other factors.  Have your blood pressure checked regularly, as often as told by your health care provider. Working with your health care provider  Review all the medicines you take with your health care  provider because there may be side effects or interactions.  Talk with your health care provider about your diet, exercise habits, and other lifestyle factors that may be contributing to hypertension.  Visit your health care provider regularly. Your health care provider can help you create and adjust your plan for managing hypertension. Will I need medicine to control my blood pressure? Your health care provider may prescribe medicine if lifestyle changes are not enough to get your blood pressure under control, and if:  Your systolic blood pressure is 130 or higher.  Your diastolic blood pressure is 80 or higher. Take medicines only as told by your health care provider. Follow the directions carefully. Blood pressure medicines must be taken as prescribed. The medicine does not work as well when you skip doses. Skipping doses also puts you at risk for problems. Contact a health care provider if:  You think you are having a reaction to medicines you have taken.  You have repeated (recurrent) headaches.  You feel dizzy.  You have swelling in your ankles.  You have trouble with your vision. Get help right away if:  You develop a severe headache or confusion.  You have unusual weakness or numbness, or you feel faint.  You have severe pain in your chest or abdomen.  You vomit repeatedly.  You have trouble breathing. Summary  Hypertension is when the force of blood pumping through your arteries is too strong. If this condition is not controlled, it may put you at risk for serious complications.  Your personal target blood pressure may vary depending on your medical conditions, your age, and other factors. For most people, a normal blood pressure is less than 120/80.  Hypertension is managed by lifestyle changes, medicines, or both. Lifestyle changes include weight loss, eating a healthy, low-sodium diet, exercising more, and limiting alcohol. This information is not intended to  replace advice given to you by your health care provider. Make sure you discuss any questions you have with your health care provider. Document Revised: 02/26/2019 Document Reviewed: 10/02/2016 Elsevier Patient Education  [place].

## 2020-09-16 LAB — COMPREHENSIVE METABOLIC PANEL
AG Ratio: 1.4 (calc) (ref 1.0–2.5)
ALT: 30 U/L (ref 9–46)
AST: 33 U/L (ref 10–35)
Albumin: 4.4 g/dL (ref 3.6–5.1)
Alkaline phosphatase (APISO): 74 U/L (ref 35–144)
BUN: 14 mg/dL (ref 7–25)
CO2: 24 mmol/L (ref 20–32)
Calcium: 10.2 mg/dL (ref 8.6–10.3)
Chloride: 103 mmol/L (ref 98–110)
Creat: 0.89 mg/dL (ref 0.70–1.25)
Globulin: 3.2 g/dL (calc) (ref 1.9–3.7)
Glucose, Bld: 84 mg/dL (ref 65–99)
Potassium: 4.7 mmol/L (ref 3.5–5.3)
Sodium: 140 mmol/L (ref 135–146)
Total Bilirubin: 0.6 mg/dL (ref 0.2–1.2)
Total Protein: 7.6 g/dL (ref 6.1–8.1)

## 2020-09-16 LAB — URINALYSIS, ROUTINE W REFLEX MICROSCOPIC
Bilirubin Urine: NEGATIVE
Glucose, UA: NEGATIVE
Hgb urine dipstick: NEGATIVE
Leukocytes,Ua: NEGATIVE
Nitrite: NEGATIVE
Protein, ur: NEGATIVE
Specific Gravity, Urine: 1.02 (ref 1.001–1.03)
pH: 6 (ref 5.0–8.0)

## 2020-09-16 LAB — CBC
HCT: 45.4 % (ref 38.5–50.0)
Hemoglobin: 16 g/dL (ref 13.2–17.1)
MCH: 33.4 pg — ABNORMAL HIGH (ref 27.0–33.0)
MCHC: 35.2 g/dL (ref 32.0–36.0)
MCV: 94.8 fL (ref 80.0–100.0)
MPV: 10.2 fL (ref 7.5–12.5)
Platelets: 258 10*3/uL (ref 140–400)
RBC: 4.79 10*6/uL (ref 4.20–5.80)
RDW: 12.2 % (ref 11.0–15.0)
WBC: 10.2 10*3/uL (ref 3.8–10.8)

## 2020-09-16 LAB — PSA: PSA: 0.91 ng/mL (ref <=4.0)

## 2020-09-16 LAB — LDL CHOLESTEROL, DIRECT: Direct LDL: 116 mg/dL — ABNORMAL HIGH (ref <100)

## 2021-05-16 ENCOUNTER — Other Ambulatory Visit: Payer: Self-pay | Admitting: Family Medicine

## 2021-05-16 DIAGNOSIS — I10 Essential (primary) hypertension: Secondary | ICD-10-CM

## 2021-05-24 ENCOUNTER — Encounter: Payer: Self-pay | Admitting: Family Medicine

## 2021-05-24 ENCOUNTER — Ambulatory Visit (INDEPENDENT_AMBULATORY_CARE_PROVIDER_SITE_OTHER): Payer: 59 | Admitting: Family Medicine

## 2021-05-24 ENCOUNTER — Other Ambulatory Visit: Payer: Self-pay

## 2021-05-24 VITALS — BP 156/90 | HR 95 | Temp 97.6°F | Wt 212.8 lb

## 2021-05-24 DIAGNOSIS — I10 Essential (primary) hypertension: Secondary | ICD-10-CM

## 2021-05-24 DIAGNOSIS — L720 Epidermal cyst: Secondary | ICD-10-CM

## 2021-05-24 DIAGNOSIS — H6120 Impacted cerumen, unspecified ear: Secondary | ICD-10-CM

## 2021-05-24 DIAGNOSIS — F1020 Alcohol dependence, uncomplicated: Secondary | ICD-10-CM

## 2021-05-24 DIAGNOSIS — F341 Dysthymic disorder: Secondary | ICD-10-CM

## 2021-05-24 DIAGNOSIS — Z720 Tobacco use: Secondary | ICD-10-CM | POA: Diagnosis not present

## 2021-05-24 LAB — URINALYSIS, ROUTINE W REFLEX MICROSCOPIC
Ketones, ur: 15 — AB
Leukocytes,Ua: NEGATIVE
Nitrite: NEGATIVE
Specific Gravity, Urine: >=1.03 — AB (ref 1.000–1.030)
Urine Glucose: NEGATIVE
Urobilinogen, UA: 0.2 (ref 0.0–1.0)
pH: 6 (ref 5.0–8.0)

## 2021-05-24 LAB — COMPREHENSIVE METABOLIC PANEL
ALT: 32 U/L (ref 0–53)
AST: 33 U/L (ref 0–37)
Albumin: 4.6 g/dL (ref 3.5–5.2)
Alkaline Phosphatase: 69 U/L (ref 39–117)
BUN: 13 mg/dL (ref 6–23)
CO2: 25 mEq/L (ref 19–32)
Calcium: 10 mg/dL (ref 8.4–10.5)
Chloride: 106 mEq/L (ref 96–112)
Creatinine, Ser: 0.95 mg/dL (ref 0.40–1.50)
GFR: 85.25 mL/min (ref >60.00)
Glucose, Bld: 76 mg/dL (ref 70–99)
Potassium: 4.3 mEq/L (ref 3.5–5.1)
Sodium: 143 mEq/L (ref 135–145)
Total Bilirubin: 0.8 mg/dL (ref 0.2–1.2)
Total Protein: 7.7 g/dL (ref 6.0–8.3)

## 2021-05-24 LAB — CBC
HCT: 44.3 % (ref 39.0–52.0)
Hemoglobin: 15.1 g/dL (ref 13.0–17.0)
MCHC: 34 g/dL (ref 30.0–36.0)
MCV: 100.6 fl — ABNORMAL HIGH (ref 78.0–100.0)
Platelets: 249 10*3/uL (ref 150.0–400.0)
RBC: 4.4 Mil/uL (ref 4.22–5.81)
RDW: 14.3 % (ref 11.5–15.5)
WBC: 11.1 10*3/uL — ABNORMAL HIGH (ref 4.0–10.5)

## 2021-05-24 MED ORDER — BUPROPION HCL ER (SR) 100 MG PO TB12: 100.0000 mg | ORAL_TABLET | Freq: Two times a day (BID) | ORAL | 2 refills | Status: DC

## 2021-05-24 MED ORDER — LISINOPRIL-HYDROCHLOROTHIAZIDE 10-12.5 MG PO TABS: 1.0000 | ORAL_TABLET | Freq: Every day | ORAL | 0 refills | Status: DC

## 2021-05-24 NOTE — Progress Notes (Signed)
Established Patient Office Visit  Subjective:  Patient ID: William Pitts, male    DOB: August 21, 1956  Age: 65 y.o. MRN: [id number]  CC:  Chief Complaint  Patient presents with   Follow-up    F/u on refills on med. Pt c/o dizzy spells.     HPI William Pitts presents for follow-up of hypertension, dysthymia, and alcohol use.  Patient had lost his insurance and was unable to follow-up until now.  He has been out of his medicines for a month or so.  He has experienced dizzy spells described as lightheadedness when he stands.  Denies headaches or blurred vision.  Continues to smoke.  Continues to drink daily, up to several glasses of wine daily.  He has a mass underneath his jaw that is draining purulent material from time to time.  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 No pertinent surgical history.  History reviewed. No pertinent family history.  Social History   Socioeconomic History   Marital status: Married    Spouse name: Not on file   Number of children: Not on file   Years of education: Not on file   Highest education level: Not on file  Occupational History   Not on file  Tobacco Use   Smoking status: Some Days    Packs/day: 1.00    Pack years: 0.00    Types: Cigarettes    Last attempt to quit: 02/02/2018    Years since quitting: 3.3   Smokeless tobacco: Never   Tobacco comments:    vapes with nicotene now  Vaping Use   Vaping Use: Former  Substance and Sexual Activity   Alcohol use: Yes    Comment: daily wine   Drug use: Yes    Types: Marijuana   Sexual activity: Not on file  Other Topics Concern   Not on file  Social History Narrative   From [place]. Has not seen a PCP in 19 years per patient. Smoker. Son committed suicide "over a girl" last month. 01/08/2017    Social Determinants of Health   Financial Resource Strain: Not on file  Food Insecurity: Not on file  Transportation Needs: Not on file  Physical Activity: Not on file  Stress: Not on file  Social Connections: Not on file  Intimate Partner Violence: Not on file    Outpatient Medications Prior to Visit  Medication Sig Dispense Refill   aspirin EC 81 MG tablet Take 81 mg by mouth daily.     Multiple Vitamins-Minerals (MULTIVITAMIN WITH MINERALS) tablet Take 1 tablet by mouth daily.     Omega-3 Fatty Acids (FISH OIL PO) Take 1 capsule by mouth daily.     buPROPion (WELLBUTRIN SR) 100 MG 12 hr tablet Take 1 tablet (100 mg total) by mouth 2 (two) times daily. 60 tablet 2   lidocaine (LIDODERM) 5 % May apply one patch daily for 12 hours only. Remove and discard. (Patient not taking: Reported on 05/24/2021) 30 patch 1   Carbamide Peroxide-Saline (EAR WAX CLEANSING) 6.5 % KIT Follow directions on kit. (Patient not taking: Reported on 05/24/2021) 1 kit 1   lisinopril-hydrochlorothiazide (ZESTORETIC) 10-12.5 MG tablet TAKE 1 TABLET BY MOUTH DAILY 90 tablet 0   No facility-administered medications prior to visit.    No Known Allergies  ROS Review of Systems  Constitutional:  Negative for diaphoresis, fatigue, fever and unexpected weight change.  HENT: Negative.    Eyes:  Negative for photophobia and visual disturbance.  Respiratory: Negative.    Cardiovascular: Negative.   Gastrointestinal: Negative.   Genitourinary: Negative.   Neurological:  Positive for light-headedness. Negative for dizziness and headaches.  Psychiatric/Behavioral:  Positive for dysphoric mood.      Objective:    Physical Exam Vitals and nursing note reviewed.  Constitutional:      General: He is not in acute distress.    Appearance: Normal appearance. He is not ill-appearing, toxic-appearing or diaphoretic.  HENT:     Head: Normocephalic and atraumatic.     Right Ear: Tympanic membrane and external ear  normal. There is impacted cerumen.     Left Ear: Tympanic membrane and external ear normal. There is impacted cerumen.     Mouth/Throat:     Mouth: Mucous membranes are moist.     Pharynx: Oropharynx is clear. No oropharyngeal exudate or posterior oropharyngeal erythema.  Eyes:     General: No scleral icterus.       Right eye: No discharge.        Left eye: No discharge.     Extraocular Movements: Extraocular movements intact.     Conjunctiva/sclera: Conjunctivae normal.     Pupils: Pupils are equal, round, and reactive to light.  Neck:     Vascular: No carotid bruit.  Cardiovascular:     Rate and Rhythm: Normal rate and regular rhythm.  Pulmonary:     Effort: Pulmonary effort is normal.     Breath sounds: Normal breath sounds.  Abdominal:     General: Bowel sounds are normal. There is no distension.     Palpations: There is no mass.     Tenderness: There is no abdominal tenderness.     Hernia: No hernia is present.  Musculoskeletal:     Right shoulder: Normal range of motion.     Left shoulder: Normal range of motion.     Cervical back: Normal range of motion and neck supple. No rigidity or tenderness.     Right lower leg: No edema.     Left lower leg: No edema.  Lymphadenopathy:     Cervical: No cervical adenopathy.  Skin:    General: Skin is warm and dry.     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  Psychiatric:        Mood and Affect: Mood normal.        Behavior: Behavior normal.    BP (!) 156/90   Pulse 95   Temp 97.6 F (36.4 C) (Temporal)   Wt 212 lb 12.8 oz (96.5 kg)   SpO2 98%   BMI 32.36 kg/m  Wt Readings from Last 3 Encounters:  05/24/21 212 lb 12.8 oz (96.5 kg)  09/15/20 212 lb 6.4 oz (96.3 kg)  03/28/20 212 lb 12.8 oz (96.5 kg)     Health Maintenance Due  Topic Date Due   Pneumococcal Vaccine 66-36 Years old (1 - PCV) Never done   Hepatitis C  Screening  Never done   TETANUS/TDAP  Never done   COLONOSCOPY (Pts 45-46yr Insurance coverage will need to be confirmed)  Never done   Zoster Vaccines- Shingrix (1 of 2) Never done   COVID-19 Vaccine (3 - Booster for  Pfizer series) 09/18/2020    There are no preventive care reminders to display for this patient.  No results found for: TSH Lab Results  Component Value Date   WBC 10.2 09/15/2020   HGB 16.0 09/15/2020   HCT 45.4 09/15/2020   MCV 94.8 09/15/2020   PLT 258 09/15/2020   Lab Results  Component Value Date   NA 140 09/15/2020   K 4.7 09/15/2020   CO2 24 09/15/2020   GLUCOSE 84 09/15/2020   BUN 14 09/15/2020   CREATININE 0.89 09/15/2020   BILITOT 0.6 09/15/2020   ALKPHOS 59 02/04/2018   AST 33 09/15/2020   ALT 30 09/15/2020   PROT 7.6 09/15/2020   ALBUMIN 4.8 02/04/2018   CALCIUM 10.2 09/15/2020   ANIONGAP 7 12/30/2016   GFR 90.04 03/23/2020   Lab Results  Component Value Date   CHOL 228 (H) 02/04/2018   Lab Results  Component Value Date   HDL 94.10 02/04/2018   Lab Results  Component Value Date   LDLCALC 119 (H) 02/04/2018   Lab Results  Component Value Date   TRIG 74.0 02/04/2018   Lab Results  Component Value Date   CHOLHDL 2 02/04/2018   No results found for: HGBA1C    Assessment & Plan:   Problem List Items Addressed This Visit       Cardiovascular and Mediastinum   Essential hypertension - Primary   Relevant Medications   lisinopril-hydrochlorothiazide (ZESTORETIC) 10-12.5 MG tablet   Other Relevant Orders   CBC   Comprehensive metabolic panel   Urinalysis, Routine w reflex microscopic     Other   Alcohol dependence, daily use (HCC)   Relevant Orders   Comprehensive metabolic panel   Tobacco abuse   Relevant Medications   buPROPion (WELLBUTRIN SR) 100 MG 12 hr tablet   Cerumen in auditory canal on examination   Dysthymia   Relevant Medications   buPROPion (WELLBUTRIN SR) 100 MG 12 hr tablet   Inclusion cyst   Relevant  Orders   Ambulatory referral to General Surgery    Meds ordered this encounter  Medications   buPROPion (WELLBUTRIN SR) 100 MG 12 hr tablet    Sig: Take 1 tablet (100 mg total) by mouth 2 (two) times daily.    Dispense:  60 tablet    Refill:  2   lisinopril-hydrochlorothiazide (ZESTORETIC) 10-12.5 MG tablet    Sig: Take 1 tablet by mouth daily.    Dispense:  90 tablet    Refill:  0     Follow-up: Return in about 3 months (around 08/24/2021), or return fasting for physical exam..  Will restart Wellbutrin and Zestoretic.  Patient will follow-up in 3 months for complete physical exam.  Discussed alcohol usage and follow-up with referred him to stop drinking, would like to see him drink no more than 1 or 2 glasses of wine daily.  Advised that the [employer] heart association as directed that no amount of alcohol is safe for the heart.  We will follow-up on smoking cessation next visit.  Referral to general surgery for inclusion cyst.  Given information on alcohol use disorder, cerumen gnosis and management of high blood pressure.  Reminded him to use eardrops.  Libby Maw, MD

## 2021-08-17 ENCOUNTER — Ambulatory Visit (INDEPENDENT_AMBULATORY_CARE_PROVIDER_SITE_OTHER): Payer: 59 | Admitting: Family Medicine

## 2021-08-17 ENCOUNTER — Encounter: Payer: Self-pay | Admitting: Family Medicine

## 2021-08-17 ENCOUNTER — Other Ambulatory Visit: Payer: Self-pay

## 2021-08-17 VITALS — BP 130/82 | HR 82 | Temp 97.9°F | Ht 68.0 in | Wt 218.6 lb

## 2021-08-17 DIAGNOSIS — Z720 Tobacco use: Secondary | ICD-10-CM | POA: Diagnosis not present

## 2021-08-17 DIAGNOSIS — L720 Epidermal cyst: Secondary | ICD-10-CM

## 2021-08-17 DIAGNOSIS — F341 Dysthymic disorder: Secondary | ICD-10-CM

## 2021-08-17 DIAGNOSIS — Z0000 Encounter for general adult medical examination without abnormal findings: Secondary | ICD-10-CM

## 2021-08-17 DIAGNOSIS — I10 Essential (primary) hypertension: Secondary | ICD-10-CM

## 2021-08-17 LAB — BASIC METABOLIC PANEL
BUN: 15 mg/dL (ref 6–23)
CO2: 25 mEq/L (ref 19–32)
Calcium: 9.8 mg/dL (ref 8.4–10.5)
Chloride: 104 mEq/L (ref 96–112)
Creatinine, Ser: 0.91 mg/dL (ref 0.40–1.50)
GFR: 89.62 mL/min (ref >60.00)
Glucose, Bld: 95 mg/dL (ref 70–99)
Potassium: 4.4 mEq/L (ref 3.5–5.1)
Sodium: 140 mEq/L (ref 135–145)

## 2021-08-17 LAB — PSA: PSA: 0.96 ng/mL (ref 0.10–4.00)

## 2021-08-17 LAB — URINALYSIS, ROUTINE W REFLEX MICROSCOPIC
Bilirubin Urine: NEGATIVE
Ketones, ur: NEGATIVE
Leukocytes,Ua: NEGATIVE
Nitrite: NEGATIVE
RBC / HPF: NONE SEEN (ref 0–?)
Specific Gravity, Urine: 1.025 (ref 1.000–1.030)
Total Protein, Urine: NEGATIVE
Urine Glucose: NEGATIVE
Urobilinogen, UA: 0.2 (ref 0.0–1.0)
pH: 6 (ref 5.0–8.0)

## 2021-08-17 LAB — LDL CHOLESTEROL, DIRECT: Direct LDL: 104 mg/dL

## 2021-08-17 MED ORDER — BUPROPION HCL ER (SR) 100 MG PO TB12: 100.0000 mg | ORAL_TABLET | Freq: Two times a day (BID) | ORAL | 2 refills | Status: DC

## 2021-08-17 MED ORDER — LISINOPRIL-HYDROCHLOROTHIAZIDE 10-12.5 MG PO TABS: 1.0000 | ORAL_TABLET | Freq: Every day | ORAL | 3 refills | Status: DC

## 2021-08-17 NOTE — Progress Notes (Signed)
Established Patient Office Visit  Subjective:  Patient ID: William Pitts, male    DOB: 02-15-1956  Age: 65 y.o. MRN: [id number]  CC:  Chief Complaint  Patient presents with   Follow-up    F/u HTN/meds.  No concerns. Declines flu shot.     HPI William Pitts presents for follow-up of hypertension dysthymia tobacco abuse, inclusion cyst and alcohol abuse.  Continues to drink some but is trying to take less.  Is ready to have his inclusion cyst removed.  Continues to smoke.  He is only taking the Wellbutrin in the morning.  He is feeling some better on it.  Having no issues taking the Zestoretic.  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  No family history on file.  Social History   Socioeconomic History   Marital status: Married    Spouse name: Not on file   Number of children: Not on file   Years of education: Not on file   Highest education level: Not on file  Occupational History   Not on file  Tobacco Use   Smoking status: Some Days    Packs/day: 1.00    Types: Cigarettes    Last attempt to quit: 02/02/2018    Years since quitting: 3.5   Smokeless tobacco: Never   Tobacco comments:    vapes with nicotene now  Vaping Use   Vaping Use: Former  Substance and Sexual Activity   Alcohol use: Yes    Comment: daily wine   Drug use: Yes    Types: Marijuana   Sexual activity: Yes  Other Topics Concern   Not on file  Social History Narrative   From [place]. Has not seen a PCP in 19 years per patient. Smoker. Son committed suicide "over a girl" last month. 01/08/2017   Social Determinants of Health   Financial Resource Strain: Not on file  Food Insecurity: Not on file  Transportation Needs: Not on file  Physical Activity: Not on file  Stress: Not on file   Social Connections: Not on file  Intimate Partner Violence: Not on file    Outpatient Medications Prior to Visit  Medication Sig Dispense Refill   aspirin EC 81 MG tablet Take 81 mg by mouth daily.     calcium gluconate 500 MG tablet Take 1 tablet by mouth 3 (three) times daily.     Garlic 818 MG TABS Take by mouth.     lidocaine (LIDODERM) 5 % May apply one patch daily for 12 hours only. Remove and discard. 30 patch 1   Multiple Vitamins-Minerals (MULTIVITAMIN WITH MINERALS) tablet Take 1 tablet by mouth daily.     Omega-3 Fatty Acids (FISH OIL PO) Take 1 capsule by mouth daily.     buPROPion (WELLBUTRIN SR) 100 MG 12 hr tablet Take 1 tablet (100 mg total) by mouth 2 (two) times daily. 60 tablet 2   lisinopril-hydrochlorothiazide (ZESTORETIC) 10-12.5 MG tablet Take 1 tablet by mouth daily. 90 tablet 0   No facility-administered medications prior to visit.    No Known Allergies  ROS Review of Systems  Constitutional: Negative.   HENT: Negative.    Eyes:  Negative for photophobia and visual disturbance.  Respiratory: Negative.    Cardiovascular: Negative.   Gastrointestinal: Negative.  Endocrine: Negative for polyphagia and polyuria.  Genitourinary: Negative.   Musculoskeletal:  Negative for gait problem and joint swelling.  Skin: Negative.   Neurological:  Negative for weakness and headaches.     Objective:    Physical Exam Vitals and nursing note reviewed.  Constitutional:      General: He is not in acute distress.    Appearance: Normal appearance. He is not ill-appearing, toxic-appearing or diaphoretic.  HENT:     Head: Normocephalic and atraumatic.     Right Ear: External ear normal.     Left Ear: External ear normal.     Mouth/Throat:     Mouth: Mucous membranes are moist.     Pharynx: Oropharynx is clear. No oropharyngeal exudate or posterior oropharyngeal erythema.  Eyes:     General: No scleral icterus.       Right eye: No discharge.        Left eye: No  discharge.     Conjunctiva/sclera: Conjunctivae normal.     Pupils: Pupils are equal, round, and reactive to light.  Cardiovascular:     Rate and Rhythm: Normal rate and regular rhythm.  Pulmonary:     Effort: Pulmonary effort is normal.     Breath sounds: Normal breath sounds.  Musculoskeletal:     Cervical back: No rigidity or tenderness.  Lymphadenopathy:     Cervical: No cervical adenopathy.  Skin:    General: Skin is warm and dry.  Neurological:     Mental Status: He is alert and oriented to person, place, and time.  Psychiatric:        Mood and Affect: Mood normal.        Behavior: Behavior normal.    BP 130/82   Pulse 82   Temp 97.9 F (36.6 C) (Temporal)   Ht 5\' 8"  (1.727 m)   Wt 218 lb 9.6 oz (99.2 kg)   SpO2 99%   BMI 33.24 kg/m  Wt Readings from Last 3 Encounters:  08/17/21 218 lb 9.6 oz (99.2 kg)  05/24/21 212 lb 12.8 oz (96.5 kg)  09/15/20 212 lb 6.4 oz (96.3 kg)     Health Maintenance Due  Topic Date Due   Hepatitis C Screening  Never done   TETANUS/TDAP  Never done   COLONOSCOPY (Pts 45-81yrs Insurance coverage will need to be confirmed)  Never done   Zoster Vaccines- Shingrix (1 of 2) Never done   COVID-19 Vaccine (3 - Booster for Pfizer series) 09/18/2020   INFLUENZA VACCINE  Never done    There are no preventive care reminders to display for this patient.  No results found for: TSH Lab Results  Component Value Date   WBC 11.1 (H) 05/24/2021   HGB 15.1 05/24/2021   HCT 44.3 05/24/2021   MCV 100.6 (H) 05/24/2021   PLT 249.0 05/24/2021   Lab Results  Component Value Date   NA 143 05/24/2021   K 4.3 05/24/2021   CO2 25 05/24/2021   GLUCOSE 76 05/24/2021   BUN 13 05/24/2021   CREATININE 0.95 05/24/2021   BILITOT 0.8 05/24/2021   ALKPHOS 69 05/24/2021   AST 33 05/24/2021   ALT 32 05/24/2021   PROT 7.7 05/24/2021   ALBUMIN 4.6 05/24/2021   CALCIUM 10.0 05/24/2021   ANIONGAP 7 12/30/2016   GFR 85.25 05/24/2021   Lab Results   Component Value Date   CHOL 228 (H) 02/04/2018   Lab Results  Component Value Date   HDL 94.10 02/04/2018   Lab  Results  Component Value Date   LDLCALC 119 (H) 02/04/2018   Lab Results  Component Value Date   TRIG 74.0 02/04/2018   Lab Results  Component Value Date   CHOLHDL 2 02/04/2018   No results found for: HGBA1C    Assessment & Plan:   Problem List Items Addressed This Visit       Cardiovascular and Mediastinum   Essential hypertension - Primary   Relevant Medications   lisinopril-hydrochlorothiazide (ZESTORETIC) 10-12.5 MG tablet   Other Relevant Orders   Basic metabolic panel   Urinalysis, Routine w reflex microscopic     Other   Tobacco abuse   Relevant Medications   buPROPion ER (WELLBUTRIN SR) 100 MG 12 hr tablet   Dysthymia   Relevant Medications   buPROPion ER (WELLBUTRIN SR) 100 MG 12 hr tablet   Inclusion cyst   Relevant Orders   Ambulatory referral to General Surgery   Other Visit Diagnoses     Healthcare maintenance       Relevant Orders   LDL cholesterol, direct   PSA       Meds ordered this encounter  Medications   buPROPion ER (WELLBUTRIN SR) 100 MG 12 hr tablet    Sig: Take 1 tablet (100 mg total) by mouth 2 (two) times daily.    Dispense:  60 tablet    Refill:  2   lisinopril-hydrochlorothiazide (ZESTORETIC) 10-12.5 MG tablet    Sig: Take 1 tablet by mouth daily.    Dispense:  90 tablet    Refill:  3     Follow-up: Return in about 6 months (around 02/14/2022).  Again encouraged to stop smoking.  Given information on health risks of smoking.  Given information on health maintenance and disease prevention as well as epidermoid cyst.  General surgery referral for excision of cyst in the anterior neck.  Encouraged him to take his Wellbutrin twice daily.  Declines flu shot because it made him sick in the past.  Agrees to go have another COVID booster.  Libby Maw, MD

## 2022-11-20 ENCOUNTER — Ambulatory Visit (INDEPENDENT_AMBULATORY_CARE_PROVIDER_SITE_OTHER): Payer: Medicaid Other | Admitting: Nurse Practitioner

## 2022-11-20 ENCOUNTER — Encounter: Payer: Self-pay | Admitting: Nurse Practitioner

## 2022-11-20 VITALS — BP 180/110 | HR 84 | Temp 96.4°F | Ht 68.0 in | Wt 223.0 lb

## 2022-11-20 DIAGNOSIS — R270 Ataxia, unspecified: Secondary | ICD-10-CM | POA: Diagnosis not present

## 2022-11-20 DIAGNOSIS — Z720 Tobacco use: Secondary | ICD-10-CM

## 2022-11-20 DIAGNOSIS — F1020 Alcohol dependence, uncomplicated: Secondary | ICD-10-CM | POA: Diagnosis not present

## 2022-11-20 DIAGNOSIS — I10 Essential (primary) hypertension: Secondary | ICD-10-CM

## 2022-11-20 DIAGNOSIS — E538 Deficiency of other specified B group vitamins: Secondary | ICD-10-CM

## 2022-11-20 LAB — COMPREHENSIVE METABOLIC PANEL
ALT: 50 U/L (ref 0–53)
AST: 35 U/L (ref 0–37)
Albumin: 4.5 g/dL (ref 3.5–5.2)
Alkaline Phosphatase: 68 U/L (ref 39–117)
BUN: 13 mg/dL (ref 6–23)
CO2: 27 mEq/L (ref 19–32)
Calcium: 9.9 mg/dL (ref 8.4–10.5)
Chloride: 102 mEq/L (ref 96–112)
Creatinine, Ser: 0.94 mg/dL (ref 0.40–1.50)
GFR: 85.44 mL/min (ref >60.00)
Glucose, Bld: 106 mg/dL — ABNORMAL HIGH (ref 70–99)
Potassium: 4.7 mEq/L (ref 3.5–5.1)
Sodium: 140 mEq/L (ref 135–145)
Total Bilirubin: 0.8 mg/dL (ref 0.2–1.2)
Total Protein: 7.5 g/dL (ref 6.0–8.3)

## 2022-11-20 LAB — B12 AND FOLATE PANEL
Folate: 5.1 ng/mL — ABNORMAL LOW (ref >5.9)
Vitamin B-12: 269 pg/mL (ref 211–911)

## 2022-11-20 LAB — TSH: TSH: 2.28 u[IU]/mL (ref 0.35–5.50)

## 2022-11-20 MED ORDER — LISINOPRIL 10 MG PO TABS: 10.0000 mg | ORAL_TABLET | Freq: Every day | ORAL | 0 refills | Status: DC

## 2022-11-20 MED ORDER — AMLODIPINE BESYLATE 5 MG PO TABS: 5.0000 mg | ORAL_TABLET | Freq: Every day | ORAL | 0 refills | Status: DC

## 2022-11-20 NOTE — Assessment & Plan Note (Signed)
Drinks 1/4gallon daily Advised about risk of liver disease Possible cause of ataxia? Check B12 and folate

## 2022-11-20 NOTE — Patient Instructions (Addendum)
Start amlodipine '5mg'$  and lisinopril '10mg'$  Maintain low sodium/low salt diet Stop tobacco use Minimize alcohol consumption. F/up with Dr. Ethelene Hal in 1week  Low-Sodium Eating Plan Sodium, which is an element that makes up salt, helps you maintain a healthy balance of fluids in your body. Too much sodium can increase your blood pressure and cause fluid and waste to be held in your body. Your health care provider or dietitian may recommend following this plan if you have high blood pressure (hypertension), kidney disease, liver disease, or heart failure. Eating less sodium can help lower your blood pressure, reduce swelling, and protect your heart, liver, and kidneys. What are tips for following this plan? Reading food labels The Nutrition Facts label lists the amount of sodium in one serving of the food. If you eat more than one serving, you must multiply the listed amount of sodium by the number of servings. Choose foods with less than 140 mg of sodium per serving. Avoid foods with 300 mg of sodium or more per serving. Shopping  Look for lower-sodium products, often labeled as "low-sodium" or "no salt added." Always check the sodium content, even if foods are labeled as "unsalted" or "no salt added." Buy fresh foods. Avoid canned foods and pre-made or frozen meals. Avoid canned, cured, or processed meats. Buy breads that have less than 80 mg of sodium per slice. Cooking  Eat more home-cooked food and less restaurant, buffet, and fast food. Avoid adding salt when cooking. Use salt-free seasonings or herbs instead of table salt or sea salt. Check with your health care provider or pharmacist before using salt substitutes. Cook with plant-based oils, such as canola, sunflower, or olive oil. Meal planning When eating at a restaurant, ask that your food be prepared with less salt or no salt, if possible. Avoid dishes labeled as brined, pickled, cured, smoked, or made with soy sauce, miso, or  teriyaki sauce. Avoid foods that contain MSG (monosodium glutamate). MSG is sometimes added to [place] food, bouillon, and some canned foods. Make meals that can be grilled, baked, poached, roasted, or steamed. These are generally made with less sodium. General information Most people on this plan should limit their sodium intake to 1,500-2,000 mg (milligrams) of sodium each day. What foods should I eat? Fruits Fresh, frozen, or canned fruit. Fruit juice. Vegetables Fresh or frozen vegetables. "No salt added" canned vegetables. "No salt added" tomato sauce and paste. Low-sodium or reduced-sodium tomato and vegetable juice. Grains Low-sodium cereals, including oats, puffed wheat and rice, and shredded wheat. Low-sodium crackers. Unsalted rice. Unsalted pasta. Low-sodium bread. Whole-grain breads and whole-grain pasta. Meats and other proteins Fresh or frozen (no salt added) meat, poultry, seafood, and fish. Low-sodium canned tuna and salmon. Unsalted nuts. Dried peas, beans, and lentils without added salt. Unsalted canned beans. Eggs. Unsalted nut butters. Dairy Milk. Soy milk. Cheese that is naturally low in sodium, such as ricotta cheese, fresh mozzarella, or Swiss cheese. Low-sodium or reduced-sodium cheese. Cream cheese. Yogurt. Seasonings and condiments Fresh and dried herbs and spices. Salt-free seasonings. Low-sodium mustard and ketchup. Sodium-free salad dressing. Sodium-free light mayonnaise. Fresh or refrigerated horseradish. Lemon juice. Vinegar. Other foods Homemade, reduced-sodium, or low-sodium soups. Unsalted popcorn and pretzels. Low-salt or salt-free chips. The items listed above may not be a complete list of foods and beverages you can eat. Contact a dietitian for more information. What foods should I avoid? Vegetables Sauerkraut, pickled vegetables, and relishes. Olives. [place] fries. Onion rings. Regular canned vegetables (not low-sodium or reduced-sodium). Regular  canned  tomato sauce and paste (not low-sodium or reduced-sodium). Regular tomato and vegetable juice (not low-sodium or reduced-sodium). Frozen vegetables in sauces. Grains Instant hot cereals. Bread stuffing, pancake, and biscuit mixes. Croutons. Seasoned rice or pasta mixes. Noodle soup cups. Boxed or frozen macaroni and cheese. Regular salted crackers. Self-rising flour. Meats and other proteins Meat or fish that is salted, canned, smoked, spiced, or pickled. Precooked or cured meat, such as sausages or meat loaves. Bernie[place] Salines. Ham. Pepperoni. Hot dogs. Corned beef. Chipped beef. Salt pork. Jerky. Pickled herring. Anchovies and sardines. Regular canned tuna. Salted nuts. Dairy Processed cheese and cheese spreads. Hard cheeses. Cheese curds. Blue cheese. Feta cheese. String cheese. Regular cottage cheese. Buttermilk. Canned milk. Fats and oils Salted butter. Regular margarine. Ghee. Bacon fat. Seasonings and condiments Onion salt, garlic salt, seasoned salt, table salt, and sea salt. Canned and packaged gravies. Worcestershire sauce. Tartar sauce. Barbecue sauce. Teriyaki sauce. Soy sauce, including reduced-sodium. Steak sauce. Fish sauce. Oyster sauce. Cocktail sauce. Horseradish that you find on the shelf. Regular ketchup and mustard. Meat flavorings and tenderizers. Bouillon cubes. Hot sauce. Pre-made or packaged marinades. Pre-made or packaged taco seasonings. Relishes. Regular salad dressings. Salsa. Other foods Salted popcorn and pretzels. Corn chips and puffs. Potato and tortilla chips. Canned or dried soups. Pizza. Frozen entrees and pot pies. The items listed above may not be a complete list of foods and beverages you should avoid. Contact a dietitian for more information. Summary Eating less sodium can help lower your blood pressure, reduce swelling, and protect your heart, liver, and kidneys. Most people on this plan should limit their sodium intake to 1,500-2,000 mg (milligrams) of sodium each  day. Canned, boxed, and frozen foods are high in sodium. Restaurant foods, fast foods, and pizza are also very high in sodium. You also get sodium by adding salt to food. Try to cook at home, eat more fresh fruits and vegetables, and eat less fast food and canned, processed, or prepared foods. This information is not intended to replace advice given to you by your health care provider. Make sure you discuss any questions you have with your health care provider. Document Revised: 12/10/2019 Document Reviewed: 10/06/2019 Elsevier Patient Education  [place].

## 2022-11-20 NOTE — Assessment & Plan Note (Signed)
HTN urgency No BP medication x 39month. Admits to high sodium diet Drinks 1/4gallon of wine EOD Smokes 1/2ppd x 528yrOccassionally smokes marijuana BP Readings from Last 3 Encounters:  11/20/22 (!) 180/110  08/17/21 130/82  05/24/21 (!) 156/90    Start lisinopril and amlodipine Check CMP and TSH F/up in 1week with pcp Advised about the need to quit tobacco use and maintain low sodium diet

## 2022-11-20 NOTE — Progress Notes (Unsigned)
Established Patient Visit  Patient: William Pitts   DOB: 1956/08/25   67 y.o. Male  MRN: [id number] Visit Date: 11/21/2022  Subjective:    Chief Complaint  Patient presents with   Acute Visit    C/o dizziness after he stopped taking his BP med 6 months ago    Dizziness This is a chronic problem. Episode onset: onset 43month ago. The problem has been unchanged. Pertinent negatives include no abdominal pain, anorexia, arthralgias, change in bowel habit, chest pain, chills, congestion, coughing, diaphoresis, fatigue, fever, headaches, joint swelling, myalgias, nausea, neck pain, numbness, rash, sore throat, swollen glands, urinary symptoms, vertigo, visual change, vomiting or weakness. Nothing aggravates the symptoms. He has tried nothing for the symptoms.   Essential hypertension HTN urgency No BP medication x 639month Admits to high sodium diet Drinks 1/4gallon of wine EOD Smokes 1/2ppd x [id number] smokes marijuana BP Readings from Last 3 Encounters:  11/20/22 (!) 180/110  08/17/21 130/82  05/24/21 (!) 156/90    Start lisinopril and amlodipine Check CMP and TSH F/up in 1week with pcp Advised about the need to quit tobacco use and maintain low sodium diet  Alcohol dependence, daily use ([facility] 1/4[place]on daily Advised about risk of liver disease Possible cause of ataxia? Check B12 and folate  Reviewed medical, surgical, and social history today  Medications: Outpatient Medications Prior to Visit  Medication Sig   aspirin EC 81 MG tablet Take 81 mg by mouth daily.   Multiple Vitamins-Minerals (MULTIVITAMIN WITH MINERALS) tablet Take 1 tablet by mouth daily.   Omega-3 Fatty Acids (FISH OIL PO) Take 1 capsule by mouth daily.   buPROPion ER (WELLBUTRIN SR) 100 MG 12 hr tablet Take 1 tablet (100 mg total) by mouth 2 (two) times daily. (Patient not taking: Reported on 11/20/2022)   [DISCONTINUED] calcium gluconate 500 MG tablet Take 1 tablet by mouth 3  (three) times daily. (Patient not taking: Reported on 11/20/2022)   [DISCONTINUED] Garlic 100182 TABS Take by mouth. (Patient not taking: Reported on 11/20/2022)   [DISCONTINUED] lidocaine (LIDODERM) 5 % May apply one patch daily for 12 hours only. Remove and discard. (Patient not taking: Reported on 11/20/2022)   [DISCONTINUED] lisinopril-hydrochlorothiazide (ZESTORETIC) 10-12.5 MG tablet Take 1 tablet by mouth daily. (Patient not taking: Reported on 11/20/2022)   No facility-administered medications prior to visit.   Reviewed past medical and social history.   ROS per HPI above      Objective:  BP (!) 180/110   Pulse 84   Temp (!) 96.4 F (35.8 C) (Temporal)   Ht '5\' 8"'$  (1.727 m)   Wt 223 lb (101.2 kg)   SpO2 99%   BMI 33.91 kg/m      Physical Exam Eyes:     Extraocular Movements: Extraocular movements intact.     Conjunctiva/sclera: Conjunctivae normal.     Pupils: Pupils are equal, round, and reactive to light.  Neck:     Thyroid: No thyroid mass, thyromegaly or thyroid tenderness.  Cardiovascular:     Rate and Rhythm: Normal rate and regular rhythm.     Pulses: Normal pulses.     Heart sounds: Normal heart sounds.  Pulmonary:     Effort: Pulmonary effort is normal.     Breath sounds: Normal breath sounds.  Musculoskeletal:     Cervical back: Normal range of motion and neck supple.     Right lower leg: No  edema.     Left lower leg: No edema.  Lymphadenopathy:     Cervical: No cervical adenopathy.  Skin:    General: Skin is warm and dry.  Neurological:     Mental Status: He is alert and oriented to person, place, and time.     Cranial Nerves: No cranial nerve deficit.     Motor: Motor function is intact.     Coordination: Romberg sign negative. Coordination normal. Finger-Nose-Finger Test and Heel to Shin Test normal.     Gait: Gait abnormal and tandem walk abnormal.  Psychiatric:        Mood and Affect: Mood normal.        Behavior: Behavior normal.        Thought  Content: Thought content normal.     Results for orders placed or performed in visit on 11/20/22  Comprehensive metabolic panel  Result Value Ref Range   Sodium 140 135 - 145 mEq/L   Potassium 4.7 3.5 - 5.1 mEq/L   Chloride 102 96 - 112 mEq/L   CO2 27 19 - 32 mEq/L   Glucose, Bld 106 (H) 70 - 99 mg/dL   BUN 13 6 - 23 mg/dL   Creatinine, Ser 0.94 0.40 - 1.50 mg/dL   Total Bilirubin 0.8 0.2 - 1.2 mg/dL   Alkaline Phosphatase 68 39 - 117 U/L   AST 35 0 - 37 U/L   ALT 50 0 - 53 U/L   Total Protein 7.5 6.0 - 8.3 g/dL   Albumin 4.5 3.5 - 5.2 g/dL   GFR 85.44 >60.00 mL/min   Calcium 9.9 8.4 - 10.5 mg/dL  B12 and Folate Panel  Result Value Ref Range   Vitamin B-12 269 211 - 911 pg/mL   Folate 5.1 (L) >5.9 ng/mL  TSH  Result Value Ref Range   TSH 2.28 0.35 - 5.50 uIU/mL      Assessment & Plan:    Problem List Items Addressed This Visit       Cardiovascular and Mediastinum   Essential hypertension - Primary    HTN urgency No BP medication x 21month. Admits to high sodium diet Drinks 1/4gallon of wine EOD Smokes 1/2ppd x [id number] smokes marijuana BP Readings from Last 3 Encounters:  11/20/22 (!) 180/110  08/17/21 130/82  05/24/21 (!) 156/90    Start lisinopril and amlodipine Check CMP and TSH F/up in 1week with pcp Advised about the need to quit tobacco use and maintain low sodium diet      Relevant Medications   amLODipine (NORVASC) 5 MG tablet   lisinopril (ZESTRIL) 10 MG tablet   Other Relevant Orders   Comprehensive metabolic panel (Completed)     Other   Al[place] dependence, daily use ([facility]   Drinks 1/4gallon daily Advised about risk of liver disease Possible cause of ataxia? Check B12 and folate      Relevant Medications   folic acid (FOLVITE) 1 MG tablet   cyanocobalamin (VITAMIN B12) 1000 MCG tablet   Other Relevant Orders   B12 and Folate Panel (Completed)   Tobacco abuse   Other Visit Diagnoses     Ataxia       Relevant Orders    Comprehensive metabolic panel (Completed)   B12 and Folate Panel (Completed)   TSH (Completed)   Folate deficiency       Relevant Medications   folic acid (FOLVITE) 1 MG tablet   cyanocobalamin (VITAMIN B12) 1000 MCG tablet      Return  in about 1 week (around 11/27/2022) for HTN with Dr. Ethelene Hal.     Wilfred Lacy, NP

## 2022-11-21 MED ORDER — FOLIC ACID 1 MG PO TABS: 1.0000 mg | ORAL_TABLET | Freq: Every day | ORAL | 3 refills | Status: DC

## 2022-11-21 MED ORDER — VITAMIN B-12 1000 MCG PO TABS: 1000.0000 ug | ORAL_TABLET | Freq: Every day | ORAL | 3 refills | Status: AC

## 2022-11-26 ENCOUNTER — Encounter: Payer: Self-pay | Admitting: Family Medicine

## 2022-11-26 ENCOUNTER — Ambulatory Visit (INDEPENDENT_AMBULATORY_CARE_PROVIDER_SITE_OTHER): Payer: Medicaid Other | Admitting: Family Medicine

## 2022-11-26 VITALS — BP 154/92 | HR 85 | Temp 97.1°F | Ht 68.0 in | Wt 222.0 lb

## 2022-11-26 DIAGNOSIS — E538 Deficiency of other specified B group vitamins: Secondary | ICD-10-CM | POA: Diagnosis not present

## 2022-11-26 DIAGNOSIS — Z720 Tobacco use: Secondary | ICD-10-CM

## 2022-11-26 DIAGNOSIS — I10 Essential (primary) hypertension: Secondary | ICD-10-CM

## 2022-11-26 DIAGNOSIS — Z789 Other specified health status: Secondary | ICD-10-CM | POA: Diagnosis not present

## 2022-11-26 MED ORDER — LISINOPRIL 20 MG PO TABS: 20.0000 mg | ORAL_TABLET | Freq: Every day | ORAL | 3 refills | Status: DC

## 2022-11-26 NOTE — Progress Notes (Signed)
Established Patient Office Visit   Subjective:  Patient ID: William Pitts, male    DOB: 12-Jul-1956  Age: 67 y.o. MRN: [id number]  Chief Complaint  Patient presents with   Follow-up    1 week follow up on BP seen William Pitts last week. No concerns.     HPI Encounter Diagnoses  Name Primary?   Folate deficiency Yes   Tobacco abuse    Essential hypertension    Alcohol use    For follow-up of hypertension.  Recently started amlodipine 5 mg with lisinopril 10 mg.  Tolerating the drugs well.  Continues to smoke about a pack a day.  Does not drink every day but when he does he will consume 4 glasses of wine.  Continues with folate and B12 supplementation.  He has been lost to follow-up since 2022.  He had lost his insurance.   Review of Systems  Constitutional: Negative.   HENT: Negative.    Eyes:  Negative for blurred vision, discharge and redness.  Respiratory: Negative.    Cardiovascular: Negative.   Gastrointestinal:  Negative for abdominal pain.  Genitourinary: Negative.   Musculoskeletal: Negative.  Negative for myalgias.  Skin:  Negative for rash.  Neurological:  Negative for tingling, loss of consciousness and weakness.  Endo/Heme/Allergies:  Negative for polydipsia.     Current Outpatient Medications:    amLODipine (NORVASC) 5 MG tablet, Take 1 tablet (5 mg total) by mouth daily., Disp: 90 tablet, Rfl: 0   aspirin EC 81 MG tablet, Take 81 mg by mouth daily., Disp: , Rfl:    buPROPion ER (WELLBUTRIN SR) 100 MG 12 hr tablet, Take 1 tablet (100 mg total) by mouth 2 (two) times daily., Disp: 60 tablet, Rfl: 2   cyanocobalamin (VITAMIN B12) 1000 MCG tablet, Take 1 tablet (1,000 mcg total) by mouth daily., Disp: 90 tablet, Rfl: 3   folic acid (FOLVITE) 1 MG tablet, Take 1 tablet (1 mg total) by mouth daily., Disp: 90 tablet, Rfl: 3   lisinopril (ZESTRIL) 20 MG tablet, Take 1 tablet (20 mg total) by mouth daily., Disp: 90 tablet, Rfl: 3   Multiple Vitamins-Minerals (MULTIVITAMIN  WITH MINERALS) tablet, Take 1 tablet by mouth daily., Disp: , Rfl:    Omega-3 Fatty Acids (FISH OIL PO), Take 1 capsule by mouth daily., Disp: , Rfl:    Objective:     BP (!) 154/92 (BP Location: Right Arm, Patient Position: Sitting, Cuff Size: Normal)   Pulse 85   Temp (!) 97.1 F (36.2 C) (Temporal)   Ht '5\' 8"'$  (1.727 m)   Wt 222 lb (100.7 kg)   SpO2 98%   BMI 33.75 kg/m    Physical Exam Constitutional:      General: He is not in acute distress.    Appearance: Normal appearance. He is not ill-appearing, toxic-appearing or diaphoretic.  HENT:     Head: Normocephalic and atraumatic.     Right Ear: External ear normal.     Left Ear: External ear normal.     Mouth/Throat:     Mouth: Mucous membranes are moist.     Pharynx: Oropharynx is clear. No oropharyngeal exudate or posterior oropharyngeal erythema.  Eyes:     General: No scleral icterus.       Right eye: No discharge.        Left eye: No discharge.     Extraocular Movements: Extraocular movements intact.     Conjunctiva/sclera: Conjunctivae normal.  Cardiovascular:     Rate and Rhythm:  Normal rate and regular rhythm.  Pulmonary:     Effort: Pulmonary effort is normal. No respiratory distress.     Breath sounds: Normal breath sounds.  Abdominal:     General: Bowel sounds are normal.     Tenderness: There is no abdominal tenderness. There is no guarding.  Musculoskeletal:     Cervical back: No rigidity or tenderness.  Skin:    General: Skin is warm and dry.  Neurological:     Mental Status: He is alert and oriented to person, place, and time.  Psychiatric:        Mood and Affect: Mood normal.        Behavior: Behavior normal.      No results found for any visits on 11/26/22.    The ASCVD Risk score (Arnett DK, et al., 2019) failed to calculate for the following reasons:   Cannot find a previous HDL lab   Cannot find a previous total cholesterol lab    Assessment & Plan:   Folate deficiency  Tobacco  abuse  Essential hypertension -     Lisinopril; Take 1 tablet (20 mg total) by mouth daily.  Dispense: 90 tablet; Refill: 3  Alcohol use    Return in about 4 weeks (around 12/24/2022).   Have increased lisinopril to 20 mg daily.  Information was given on managing hypertension.  Information was given on Chantix for smoking cessation.  Patient with advised to consume no more than 2 glasses of wine daily.  Continue amlodipine, B12 supplementation and folate supplementation. Libby Maw, MD

## 2022-12-24 ENCOUNTER — Ambulatory Visit: Payer: Medicaid Other | Admitting: Family Medicine

## 2022-12-27 ENCOUNTER — Encounter: Payer: Self-pay | Admitting: Family Medicine

## 2022-12-27 ENCOUNTER — Ambulatory Visit (INDEPENDENT_AMBULATORY_CARE_PROVIDER_SITE_OTHER): Payer: Medicaid Other | Admitting: Family Medicine

## 2022-12-27 VITALS — BP 126/86 | HR 67 | Temp 97.3°F | Ht 68.0 in | Wt 226.4 lb

## 2022-12-27 DIAGNOSIS — E538 Deficiency of other specified B group vitamins: Secondary | ICD-10-CM

## 2022-12-27 DIAGNOSIS — I10 Essential (primary) hypertension: Secondary | ICD-10-CM | POA: Diagnosis not present

## 2022-12-27 DIAGNOSIS — H9191 Unspecified hearing loss, right ear: Secondary | ICD-10-CM | POA: Insufficient documentation

## 2022-12-27 DIAGNOSIS — H6123 Impacted cerumen, bilateral: Secondary | ICD-10-CM | POA: Diagnosis not present

## 2022-12-27 DIAGNOSIS — R221 Localized swelling, mass and lump, neck: Secondary | ICD-10-CM | POA: Diagnosis not present

## 2022-12-27 MED ORDER — AMLODIPINE BESYLATE 5 MG PO TABS: 5.0000 mg | ORAL_TABLET | Freq: Every day | ORAL | 0 refills | Status: DC

## 2022-12-27 MED ORDER — LISINOPRIL 20 MG PO TABS: 20.0000 mg | ORAL_TABLET | Freq: Every day | ORAL | 3 refills | Status: DC

## 2022-12-27 MED ORDER — FOLIC ACID 1 MG PO TABS: 1.0000 mg | ORAL_TABLET | Freq: Every day | ORAL | 3 refills | Status: DC

## 2022-12-27 NOTE — Progress Notes (Signed)
Established Patient Office Visit   Subjective:  Patient ID: William Pitts, male    DOB: 1956/11/06  Age: 67 y.o. MRN: [id number]  Chief Complaint  Patient presents with   Follow-up    1 month follow up on BP, ears feel clogged lots of popping little off balance.     HPI Encounter Diagnoses  Name Primary?   Excessive cerumen in both ear canals Yes   Folate deficiency    Hearing loss of right ear, unspecified hearing loss type    Submental mass    Essential hypertension    Complains of decreased hearing in the right greater than left ear over the last month or so.  Denies any recent drainage congestion sneezing or other respiratory tract symptoms.  He has served in [employer] in the past.  For follow-up of hypertension after adding amlodipine.  He is tolerating the drug well.   Review of Systems  Constitutional: Negative.   HENT:  Positive for hearing loss. Negative for ear discharge and ear pain.   Eyes:  Negative for blurred vision, discharge and redness.  Respiratory: Negative.    Cardiovascular: Negative.   Gastrointestinal:  Negative for abdominal pain.  Genitourinary: Negative.   Musculoskeletal: Negative.  Negative for myalgias.  Skin:  Negative for rash.  Neurological:  Negative for tingling, loss of consciousness and weakness.  Endo/Heme/Allergies:  Negative for polydipsia.     Current Outpatient Medications:    aspirin EC 81 MG tablet, Take 81 mg by mouth daily., Disp: , Rfl:    buPROPion ER (WELLBUTRIN SR) 100 MG 12 hr tablet, Take 1 tablet (100 mg total) by mouth 2 (two) times daily., Disp: 60 tablet, Rfl: 2   cyanocobalamin (VITAMIN B12) 1000 MCG tablet, Take 1 tablet (1,000 mcg total) by mouth daily., Disp: 90 tablet, Rfl: 3   Multiple Vitamins-Minerals (MULTIVITAMIN WITH MINERALS) tablet, Take 1 tablet by mouth daily., Disp: , Rfl:    Omega-3 Fatty Acids (FISH OIL PO), Take 1 capsule by mouth daily., Disp: , Rfl:    amLODipine (NORVASC) 5 MG tablet, Take 1  tablet (5 mg total) by mouth daily., Disp: 90 tablet, Rfl: 0   folic acid (FOLVITE) 1 MG tablet, Take 1 tablet (1 mg total) by mouth daily., Disp: 90 tablet, Rfl: 3   lisinopril (ZESTRIL) 20 MG tablet, Take 1 tablet (20 mg total) by mouth daily., Disp: 90 tablet, Rfl: 3   Objective:     BP 126/86 (BP Location: Right Arm, Patient Position: Sitting, Cuff Size: Normal)   Pulse 67   Temp (!) 97.3 F (36.3 C) (Temporal)   Ht 5' 8"$  (1.727 m)   Wt 226 lb 6.4 oz (102.7 kg)   SpO2 97%   BMI 34.42 kg/m  BP Readings from Last 3 Encounters:  12/27/22 126/86  11/26/22 (!) 154/92  11/20/22 (!) 180/110   Wt Readings from Last 3 Encounters:  12/27/22 226 lb 6.4 oz (102.7 kg)  11/26/22 222 lb (100.7 kg)  11/20/22 223 lb (101.2 kg)      Physical Exam Constitutional:      General: He is not in acute distress.    Appearance: Normal appearance. He is not ill-appearing, toxic-appearing or diaphoretic.  HENT:     Head: Normocephalic and atraumatic.      Right Ear: External ear normal.     Left Ear: External ear normal.     Ears:     Comments: Nonobstructing cerumen bilaterally.  Eyes:  General: No scleral icterus.       Right eye: No discharge.        Left eye: No discharge.     Extraocular Movements: Extraocular movements intact.     Conjunctiva/sclera: Conjunctivae normal.  Cardiovascular:     Rate and Rhythm: Normal rate and regular rhythm.  Pulmonary:     Effort: Pulmonary effort is normal. No respiratory distress.     Breath sounds: Normal breath sounds.  Skin:    General: Skin is warm and dry.  Neurological:     Mental Status: He is alert and oriented to person, place, and time.  Psychiatric:        Mood and Affect: Mood normal.        Behavior: Behavior normal.    Subjective:    William Pitts is a 67 y.o. male whom I am asked to see for evaluation of diminished hearing in both ears for the past 2 months. There is a prior history of cerumen impaction. The patient has not  been using ear drops to loosen wax immediately prior to this visit. The patient denies ear pain.  The patient's history has been marked as reviewed and updated as appropriate.  Review of Systems Pertinent items are noted in HPI.    Objective:    Auditory canal(s) of both ears are partially obstructed with cerumen.   Cerumen was removed using gentle irrigation and soft currette. Tympanic membranes are intact following the procedure.  Auditory canals are normal.    Assessment:    Cerumen Impaction without otitis externa.    Plan:    1. Care instructions given. 2. Home treatment: none. 3. Follow-up as needed.     No results found for any visits on 12/27/22.    The ASCVD Risk score (Arnett DK, et al., 2019) failed to calculate for the following reasons:   Cannot find a previous HDL lab   Cannot find a previous total cholesterol lab    Assessment & Plan:   Excessive cerumen in both ear canals  Folate deficiency -     Folic Acid; Take 1 tablet (1 mg total) by mouth daily.  Dispense: 90 tablet; Refill: 3  Hearing loss of right ear, unspecified hearing loss type -     Ambulatory referral to ENT  Submental mass -     Ambulatory referral to ENT  Essential hypertension -     amLODIPine Besylate; Take 1 tablet (5 mg total) by mouth daily.  Dispense: 90 tablet; Refill: 0 -     Lisinopril; Take 1 tablet (20 mg total) by mouth daily.  Dispense: 90 tablet; Refill: 3    Return in about 6 months (around 06/27/2023).  Patient reports that hearing is improved after removal of nonobstructing cerumen.  Will send to ENT for audiogram and evaluation of submental mass.  Libby Maw, MD

## 2023-04-17 ENCOUNTER — Telehealth: Payer: Self-pay | Admitting: Family Medicine

## 2023-04-17 ENCOUNTER — Ambulatory Visit: Payer: Medicare Other | Admitting: Family Medicine

## 2023-04-17 NOTE — Telephone Encounter (Signed)
Pt was a no show for an OV with Dr Doreene Burke on 04/17/23, I sent a no show letter.

## 2023-04-24 NOTE — Telephone Encounter (Signed)
1st no show, fee waived, letter sent 

## 2023-05-05 ENCOUNTER — Encounter: Payer: Self-pay | Admitting: Family Medicine

## 2023-05-05 ENCOUNTER — Ambulatory Visit (INDEPENDENT_AMBULATORY_CARE_PROVIDER_SITE_OTHER): Payer: Medicare Other | Admitting: Family Medicine

## 2023-05-05 VITALS — BP 115/70 | HR 96 | Temp 97.7°F | Wt 228.4 lb

## 2023-05-05 DIAGNOSIS — E538 Deficiency of other specified B group vitamins: Secondary | ICD-10-CM | POA: Diagnosis not present

## 2023-05-05 DIAGNOSIS — I10 Essential (primary) hypertension: Secondary | ICD-10-CM | POA: Diagnosis not present

## 2023-05-05 DIAGNOSIS — R221 Localized swelling, mass and lump, neck: Secondary | ICD-10-CM | POA: Diagnosis not present

## 2023-05-05 LAB — BASIC METABOLIC PANEL
BUN: 16 mg/dL (ref 6–23)
CO2: 24 mEq/L (ref 19–32)
Calcium: 9.6 mg/dL (ref 8.4–10.5)
Chloride: 104 mEq/L (ref 96–112)
Creatinine, Ser: 1.19 mg/dL (ref 0.40–1.50)
GFR: 64.18 mL/min (ref >60.00)
Glucose, Bld: 110 mg/dL — ABNORMAL HIGH (ref 70–99)
Potassium: 4 mEq/L (ref 3.5–5.1)
Sodium: 140 mEq/L (ref 135–145)

## 2023-05-05 LAB — B12 AND FOLATE PANEL
Folate: 9.6 ng/mL (ref >5.9)
Vitamin B-12: 256 pg/mL (ref 211–911)

## 2023-05-05 NOTE — Progress Notes (Signed)
Established Patient Office Visit   Subjective:  Patient ID: William Pitts, male    DOB: 03/12/1956  Age: 67 y.o. MRN: [id number]  Chief Complaint  Patient presents with   Cyst    Check bump on chin seems to becoming larger in size.     HPI Encounter Diagnoses  Name Primary?   Folate deficiency Yes   Essential hypertension    B12 deficiency    Submental mass    For follow-up of above.  Stop taking B12 and folate because he ran out of it.  Feels as though the mass under his chin is enlarging.  Had been referred to ENT but never heard about an appointment.  Mass is nontender it has not drained.  Continue Zestril and amlodipine for hypertension without issue.  Blood pressure well-controlled on the combination.   Review of Systems  Constitutional: Negative.   HENT: Negative.    Eyes:  Negative for blurred vision, discharge and redness.  Respiratory: Negative.    Cardiovascular: Negative.   Gastrointestinal:  Negative for abdominal pain.  Genitourinary: Negative.   Musculoskeletal: Negative.  Negative for myalgias.  Skin:  Negative for rash.  Neurological:  Negative for tingling, loss of consciousness and weakness.  Endo/Heme/Allergies:  Negative for polydipsia.     Current Outpatient Medications:    aspirin EC 81 MG tablet, Take 81 mg by mouth daily., Disp: , Rfl:    buPROPion ER (WELLBUTRIN SR) 100 MG 12 hr tablet, Take 1 tablet (100 mg total) by mouth 2 (two) times daily., Disp: 60 tablet, Rfl: 2   Multiple Vitamins-Minerals (MULTIVITAMIN WITH MINERALS) tablet, Take 1 tablet by mouth daily., Disp: , Rfl:    Omega-3 Fatty Acids (FISH OIL PO), Take 1 capsule by mouth daily., Disp: , Rfl:    amLODipine (NORVASC) 5 MG tablet, Take 1 tablet (5 mg total) by mouth daily. (Patient not taking: Reported on 05/05/2023), Disp: 90 tablet, Rfl: 0   cyanocobalamin (VITAMIN B12) 1000 MCG tablet, Take 1 tablet (1,000 mcg total) by mouth daily. (Patient not taking: Reported on 05/05/2023), Disp:  90 tablet, Rfl: 3   folic acid (FOLVITE) 1 MG tablet, Take 1 tablet (1 mg total) by mouth daily. (Patient not taking: Reported on 05/05/2023), Disp: 90 tablet, Rfl: 3   lisinopril (ZESTRIL) 20 MG tablet, Take 1 tablet (20 mg total) by mouth daily. (Patient not taking: Reported on 05/05/2023), Disp: 90 tablet, Rfl: 3   Objective:     BP 115/70 (BP Location: Right Arm, Patient Position: Sitting, Cuff Size: Normal)   Pulse 96   Temp 97.7 F (36.5 C) (Temporal)   Wt 228 lb 6.4 oz (103.6 kg)   SpO2 94%   BMI 34.73 kg/m    Physical Exam Constitutional:      General: He is not in acute distress.    Appearance: Normal appearance. He is not ill-appearing, toxic-appearing or diaphoretic.  HENT:     Head: Normocephalic and atraumatic.     Right Ear: External ear normal.     Left Ear: External ear normal.     Mouth/Throat:     Mouth: Mucous membranes are moist.     Pharynx: Oropharynx is clear. No oropharyngeal exudate or posterior oropharyngeal erythema.  Eyes:     General: No scleral icterus.       Right eye: No discharge.        Left eye: No discharge.     Extraocular Movements: Extraocular movements intact.     Conjunctiva/sclera:  Conjunctivae normal.  Cardiovascular:     Rate and Rhythm: Normal rate and regular rhythm.  Pulmonary:     Effort: Pulmonary effort is normal. No respiratory distress.     Breath sounds: Normal breath sounds.  Abdominal:     General: Bowel sounds are normal.  Musculoskeletal:     Cervical back: No rigidity or tenderness.  Skin:    General: Skin is warm and dry.       Neurological:     Mental Status: He is alert and oriented to person, place, and time.  Psychiatric:        Mood and Affect: Mood normal.        Behavior: Behavior normal.      No results found for any visits on 05/05/23.    The ASCVD Risk score (Arnett DK, et al., 2019) failed to calculate for the following reasons:   Cannot find a previous HDL lab   Cannot find a previous  total cholesterol lab    Assessment & Plan:   Folate deficiency -     B12 and Folate Panel  Essential hypertension -     Basic metabolic panel  B12 deficiency -     B12 and Folate Panel  Submental mass -     Ambulatory referral to ENT    Return in about 3 months (around 08/05/2023).  Due for physical in 3 months.  Second referral to ENT.  Will let me know if he does not hear about an appointment.  Will probably restart B12 and folate supplementation.  Continue Zestril and amlodipine for hypertension.  Mliss Sax, MD

## 2023-06-27 ENCOUNTER — Ambulatory Visit: Payer: Medicare Other | Admitting: Family Medicine

## 2023-06-27 ENCOUNTER — Telehealth: Payer: Self-pay | Admitting: Family Medicine

## 2023-06-27 NOTE — Telephone Encounter (Signed)
8.9.24 no show letter sent

## 2023-07-04 NOTE — Telephone Encounter (Signed)
2nd no show, final warning sent via mail

## 2023-10-21 ENCOUNTER — Encounter: Payer: Self-pay | Admitting: Family

## 2023-10-22 NOTE — Progress Notes (Signed)
  This encounter was created in error - please disregard. No show 

## 2023-10-31 ENCOUNTER — Encounter: Payer: Self-pay | Admitting: Adult Health

## 2023-10-31 ENCOUNTER — Ambulatory Visit (INDEPENDENT_AMBULATORY_CARE_PROVIDER_SITE_OTHER): Payer: Medicare Other | Admitting: Adult Health

## 2023-10-31 VITALS — BP 124/78 | HR 95 | Temp 97.8°F | Resp 19 | Ht 66.0 in | Wt 229.2 lb

## 2023-10-31 DIAGNOSIS — E66812 Obesity, class 2: Secondary | ICD-10-CM

## 2023-10-31 DIAGNOSIS — R1901 Right upper quadrant abdominal swelling, mass and lump: Secondary | ICD-10-CM

## 2023-10-31 DIAGNOSIS — R42 Dizziness and giddiness: Secondary | ICD-10-CM

## 2023-10-31 DIAGNOSIS — Z2821 Immunization not carried out because of patient refusal: Secondary | ICD-10-CM

## 2023-10-31 DIAGNOSIS — R221 Localized swelling, mass and lump, neck: Secondary | ICD-10-CM

## 2023-10-31 DIAGNOSIS — E538 Deficiency of other specified B group vitamins: Secondary | ICD-10-CM

## 2023-10-31 DIAGNOSIS — Z720 Tobacco use: Secondary | ICD-10-CM | POA: Diagnosis not present

## 2023-10-31 DIAGNOSIS — Z6836 Body mass index (BMI) 36.0-36.9, adult: Secondary | ICD-10-CM

## 2023-10-31 DIAGNOSIS — Z7689 Persons encountering health services in other specified circumstances: Secondary | ICD-10-CM

## 2023-10-31 DIAGNOSIS — Z13220 Encounter for screening for lipoid disorders: Secondary | ICD-10-CM | POA: Diagnosis not present

## 2023-10-31 DIAGNOSIS — Z131 Encounter for screening for diabetes mellitus: Secondary | ICD-10-CM

## 2023-10-31 DIAGNOSIS — Z113 Encounter for screening for infections with a predominantly sexual mode of transmission: Secondary | ICD-10-CM

## 2023-10-31 DIAGNOSIS — Z789 Other specified health status: Secondary | ICD-10-CM

## 2023-10-31 DIAGNOSIS — Z5320 Procedure and treatment not carried out because of patient's decision for unspecified reasons: Secondary | ICD-10-CM

## 2023-10-31 DIAGNOSIS — F1090 Alcohol use, unspecified, uncomplicated: Secondary | ICD-10-CM

## 2023-10-31 NOTE — Patient Instructions (Signed)
Preventive Care 65 Years and Older, Male Preventive care refers to lifestyle choices and visits with your health care provider that can promote health and wellness. Preventive care visits are also called wellness exams. What can I expect for my preventive care visit? Counseling During your preventive care visit, your health care provider may ask about your: Medical history, including: Past medical problems. Family medical history. History of falls. Current health, including: Emotional well-being. Home life and relationship well-being. Sexual activity. Memory and ability to understand (cognition). Lifestyle, including: Alcohol, nicotine or tobacco, and drug use. Access to firearms. Diet, exercise, and sleep habits. Work and work environment. Sunscreen use. Safety issues such as seatbelt and bike helmet use. Physical exam Your health care provider will check your: Height and weight. These may be used to calculate your BMI (body mass index). BMI is a measurement that tells if you are at a healthy weight. Waist circumference. This measures the distance around your waistline. This measurement also tells if you are at a healthy weight and may help predict your risk of certain diseases, such as type 2 diabetes and high blood pressure. Heart rate and blood pressure. Body temperature. Skin for abnormal spots. What immunizations do I need?  Vaccines are usually given at various ages, according to a schedule. Your health care provider will recommend vaccines for you based on your age, medical history, and lifestyle or other factors, such as travel or where you work. What tests do I need? Screening Your health care provider may recommend screening tests for certain conditions. This may include: Lipid and cholesterol levels. Diabetes screening. This is done by checking your blood sugar (glucose) after you have not eaten for a while (fasting). Hepatitis C test. Hepatitis B test. HIV (human  immunodeficiency virus) test. STI (sexually transmitted infection) testing, if you are at risk. Lung cancer screening. Colorectal cancer screening. Prostate cancer screening. Abdominal aortic aneurysm (AAA) screening. You may need this if you are a current or former smoker. Talk with your health care provider about your test results, treatment options, and if necessary, the need for more tests. Follow these instructions at home: Eating and drinking  Eat a diet that includes fresh fruits and vegetables, whole grains, lean protein, and low-fat dairy products. Limit your intake of foods with high amounts of sugar, saturated fats, and salt. Take vitamin and mineral supplements as recommended by your health care provider. Do not drink alcohol if your health care provider tells you not to drink. If you drink alcohol: Limit how much you have to 0-2 drinks a day. Know how much alcohol is in your drink. In the [place] one drink equals one 12 oz bottle of beer (355 mL), one 5 oz glass of wine (148 mL), or one 1 oz glass of hard liquor (44 mL). Lifestyle Brush your teeth every morning and night with fluoride toothpaste. Floss one time each day. Exercise for at least 30 minutes 5 or more days each week. Do not use any products that contain nicotine or tobacco. These products include cigarettes, chewing tobacco, and vaping devices, such as e-cigarettes. If you need help quitting, ask your health care provider. Do not use drugs. If you are sexually active, practice safe sex. Use a condom or other form of protection to prevent STIs. Take aspirin only as told by your health care provider. Make sure that you understand how much to take and what form to take. Work with your health care provider to find out whether it is safe   and beneficial for you to take aspirin daily. Ask your health care provider if you need to take a cholesterol-lowering medicine (statin). Find healthy ways to manage stress, such  as: Meditation, yoga, or listening to music. Journaling. Talking to a trusted person. Spending time with friends and family. Safety Always wear your seat belt while driving or riding in a vehicle. Do not drive: If you have been drinking alcohol. Do not ride with someone who has been drinking. When you are tired or distracted. While texting. If you have been using any mind-altering substances or drugs. Wear a helmet and other protective equipment during sports activities. If you have firearms in your house, make sure you follow all gun safety procedures. Minimize exposure to UV radiation to reduce your risk of skin cancer. What's next? Visit your health care provider once a year for an annual wellness visit. Ask your health care provider how often you should have your eyes and teeth checked. Stay up to date on all vaccines. This information is not intended to replace advice given to you by your health care provider. Make sure you discuss any questions you have with your health care provider. Document Revised: 05/02/2021 Document Reviewed: 05/02/2021 Elsevier Patient Education  2024 Elsevier Inc.  

## 2023-10-31 NOTE — Progress Notes (Signed)
[facility] clinic  Provider:  Kenard Gower DNP  Code Status:  Full Code  Goals of Care:     10/31/2023    9:19 AM  Advanced Directives  Does Patient Have a Medical Advance Directive? No  Would patient like information on creating a medical advance directive? No - Patient declined     Chief Complaint  Patient presents with   Establish Care    New Patient    HPI: Patient is a 67 y.o. Pitts seen today to establish care with [facility]. He is originally from [place] and moved to [place] 23 years ago. He is married with a son (16 Y/O). He has 2 brothers in [place] ([place] and 50 Y/O). He does not communicate with his brothers in [place]. He had early retirement at 67 Y/O due to his feeling dizzy all the time. 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 Mass has  foul smelling drainage daily. He also has a mass on his inner RUQ (just below the ribcage) which he noticed 7 years ago. 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 It is not tender. He smokes a pack/day for 52 years and drinks wine a quart a day.  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 No pertinent surgical history.  No Known Allergies  Outpatient Encounter Medications as of 10/31/2023  Medication Sig   aspirin EC 81 MG tablet Take 81 mg by mouth daily.   calcium carbonate (OSCAL) 1500 (600 Ca) MG TABS tablet Take 600 mg by mouth 2 (two) times daily with a meal.   cyanocobalamin (VITAMIN B12) 1000 MCG tablet Take 1 tablet (1,000 mcg total) by mouth daily.   Multiple Vitamins-Minerals (MULTIVITAMIN WITH MINERALS) tablet Take 1 tablet by mouth daily.   Omega-3 Fatty Acids (FISH OIL PO) Take 1 capsule by mouth daily.   amLODipine (NORVASC) 5 MG tablet Take 1 tablet (5 mg total) by mouth daily. (Patient not  taking: Reported on 10/31/2023)   buPROPion ER (WELLBUTRIN SR) 100 MG 12 hr tablet Take 1 tablet (100 mg total) by mouth 2 (two) times daily. (Patient not taking: Reported on 10/31/2023)   folic acid (FOLVITE) 1 MG tablet Take 1 tablet (1 mg total) by mouth daily. (Patient not taking: Reported on 10/31/2023)   lisinopril (ZESTRIL) 20 MG tablet Take 1 tablet (20 mg total) by mouth daily. (Patient not taking: Reported on 10/31/2023)   No facility-administered encounter medications on file as of 10/31/2023.    Review of Systems:  Review of Systems  Constitutional:  Negative for activity change, appetite change and fever.  HENT:  Negative for sore throat.   Eyes: Negative.   Cardiovascular:  Negative for chest pain and leg swelling.  Gastrointestinal:  Negative for abdominal distention, diarrhea and vomiting.  Genitourinary:  Negative for dysuria, frequency and urgency.  Skin:  Negative for color change.  Neurological:  Positive for dizziness. Negative for headaches.  Psychiatric/Behavioral:  Negative for behavioral problems and sleep disturbance. The patient is not nervous/anxious.     Health Maintenance  Topic Date Due   Medicare Annual Wellness (AWV)  Never done   Pneumonia Vaccine 36+ Years old (1 of 2 - PCV) Never done   Hepatitis C Screening  Never done  Zoster Vaccines- Shingrix (1 of 2) Never done   INFLUENZA VACCINE  Never done   COVID-19 Vaccine (3 - 2024-25 season) 07/20/2023   Colonoscopy  05/28/2024 (Originally 01/01/2003)   HIV Screening  Addressed   HPV VACCINES  Aged Out   DTaP/Tdap/Td  Discontinued    Physical Exam: Vitals:   10/31/23 0927  BP: 124/78  Pulse: 95  Resp: 19  Temp: 97.8 F (36.6 C)  Weight: 229 lb 3.2 oz (104 kg)  Height: 5\' 6"  (1.676 m)   Body mass index is 36.99 kg/m. Physical Exam Constitutional:      Appearance: Normal appearance.  HENT:     Head: Normocephalic and atraumatic.     Mouth/Throat:     Mouth: Mucous membranes are moist.   Eyes:     Conjunctiva/sclera: Conjunctivae normal.  Cardiovascular:     Rate and Rhythm: Normal rate and regular rhythm.     Pulses: Normal pulses.     Heart sounds: Normal heart sounds.  Pulmonary:     Effort: Pulmonary effort is normal.     Breath sounds: Normal breath sounds.  Abdominal:     General: Bowel sounds are normal.     Palpations: Abdomen is soft.  Musculoskeletal:        General: No swelling. Normal range of motion.     Cervical back: Normal range of motion.  Skin:    General: Skin is warm and dry.  Neurological:     General: No focal deficit present.     Mental Status: He is alert and oriented to person, place, and time.  Psychiatric:        Mood and Affect: Mood normal.        Behavior: Behavior normal.        Thought Content: Thought content normal.        Judgment: Judgment normal.     Labs reviewed: Basic Metabolic Panel: Recent Labs    11/20/22 1353 05/05/23 1014  NA 140 140  K 4.7 4.0  CL 102 104  CO2 27 24  GLUCOSE 106* 110*  BUN 13 16  CREATININE 0.94 1.19  CALCIUM 9.9 9.6  TSH 2.28  --    Liver Function Tests: Recent Labs    11/20/22 1353  AST 35  ALT 50  ALKPHOS 68  BILITOT 0.8  PROT 7.5  ALBUMIN 4.5   No results for input(s): "LIPASE", "AMYLASE" in the last 8760 hours. No results for input(s): "AMMONIA" in the last 8760 hours. CBC: No results for input(s): "WBC", "NEUTROABS", "HGB", "HCT", "MCV", "PLT" in the last 8760 hours. Lipid Panel: No results for input(s): "CHOL", "HDL", "LDLCALC", "TRIG", "CHOLHDL", "LDLDIRECT" in the last 8760 hours. No results found for: "HGBA1C"  Procedures since last visit: No results found.  Assessment/Plan  1. Encounter to establish care (Primary) -  established care with [facility]  2. Submental mass -  non-tender -   draining foul smelling fluid daily - CBC with Differential/Platelets - Complete Metabolic Panel with eGFR - Ambulatory referral to ENT  3. Tobacco abuse -  counseled  4.  Screening for hyperlipidemia - Lipid panel  5. Folic acid deficiency -  stopped taking folic acid - Folate - Vitamin B12  6. Screen for STD (sexually transmitted disease) - Hep C Antibody  7. Flu vaccine refused - declined flu vaccine  8. Screening for diabetes mellitus - Hemoglobin A1C  9. Colonoscopy refused -  does not want colonoscopy  10. 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 Class 2 obesity with body mass index (BMI) of 36.0 to 36.9 in adult, unspecified obesity type, unspecified whether serious comorbidity present Body mass index is 36.99 kg/m.  -  counseled regarding diet and exercise  12. Tobacco use -  counseled  13. Dizziness -  started having it around the same time he had the submental mass -  not tender  14. Alcohol use -  counseled   Labs/tests ordered:   CBC, CMP, A1C, lipid panel  Next appt:  Visit date not found

## 2023-11-01 LAB — LIPID PANEL
Cholesterol: 211 mg/dL — ABNORMAL HIGH (ref <200)
HDL: 63 mg/dL (ref >=40)
LDL Cholesterol (Calc): 128 mg/dL — ABNORMAL HIGH
Non-HDL Cholesterol (Calc): 148 mg/dL — ABNORMAL HIGH (ref <130)
Total CHOL/HDL Ratio: 3.3 (calc) (ref <5.0)
Triglycerides: 97 mg/dL (ref <150)

## 2023-11-01 LAB — COMPLETE METABOLIC PANEL WITH GFR
AG Ratio: 1.4 (calc) (ref 1.0–2.5)
ALT: 25 U/L (ref 9–46)
AST: 24 U/L (ref 10–35)
Albumin: 4.6 g/dL (ref 3.6–5.1)
Alkaline phosphatase (APISO): 83 U/L (ref 35–144)
BUN: 18 mg/dL (ref 7–25)
CO2: 27 mmol/L (ref 20–32)
Calcium: 10.8 mg/dL — ABNORMAL HIGH (ref 8.6–10.3)
Chloride: 105 mmol/L (ref 98–110)
Creat: 1.01 mg/dL (ref 0.70–1.35)
Globulin: 3.2 g/dL (ref 1.9–3.7)
Glucose, Bld: 101 mg/dL — ABNORMAL HIGH (ref 65–99)
Potassium: 5.1 mmol/L (ref 3.5–5.3)
Sodium: 141 mmol/L (ref 135–146)
Total Bilirubin: 0.3 mg/dL (ref 0.2–1.2)
Total Protein: 7.8 g/dL (ref 6.1–8.1)
eGFR: 83 mL/min/{1.73_m2} (ref >=60)

## 2023-11-01 LAB — CBC WITH DIFFERENTIAL/PLATELET
Absolute Lymphocytes: 3557 {cells}/uL (ref 850–3900)
Absolute Monocytes: 980 {cells}/uL — ABNORMAL HIGH (ref 200–950)
Basophils Absolute: 88 {cells}/uL (ref 0–200)
Basophils Relative: 0.9 %
Eosinophils Absolute: 225 {cells}/uL (ref 15–500)
Eosinophils Relative: 2.3 %
HCT: 52.4 % — ABNORMAL HIGH (ref 38.5–50.0)
Hemoglobin: 17.2 g/dL — ABNORMAL HIGH (ref 13.2–17.1)
MCH: 31.5 pg (ref 27.0–33.0)
MCHC: 32.8 g/dL (ref 32.0–36.0)
MCV: 96 fL (ref 80.0–100.0)
MPV: 10.2 fL (ref 7.5–12.5)
Monocytes Relative: 10 %
Neutro Abs: 4949 {cells}/uL (ref 1500–7800)
Neutrophils Relative %: 50.5 %
Platelets: 256 10*3/uL (ref 140–400)
RBC: 5.46 10*6/uL (ref 4.20–5.80)
RDW: 12.5 % (ref 11.0–15.0)
Total Lymphocyte: 36.3 %
WBC: 9.8 10*3/uL (ref 3.8–10.8)

## 2023-11-01 LAB — HEMOGLOBIN A1C
Hgb A1c MFr Bld: 5.9 %{Hb} — ABNORMAL HIGH (ref <5.7)
Mean Plasma Glucose: 123 mg/dL
eAG (mmol/L): 6.8 mmol/L

## 2023-11-01 LAB — VITAMIN B12: Vitamin B-12: 556 pg/mL (ref 200–1100)

## 2023-11-01 LAB — HEPATITIS C ANTIBODY: Hepatitis C Ab: NONREACTIVE

## 2023-11-01 LAB — FOLATE: Folate: 7.2 ng/mL

## 2023-11-04 ENCOUNTER — Other Ambulatory Visit: Payer: Self-pay | Admitting: Adult Health

## 2023-11-04 NOTE — Progress Notes (Signed)
-      Cholesterol 211 and LDL 128, elevated, will need to exercise for at least 150 minutes/week, low-fat diet -    A1c 5.9, ranging as prediabetic -    Vitamin B12, folate normal -   Hep C antibody negative -    Calcium 10.8, elevated and hemoglobin 17.2, elevated, will repeat on next clinic visit

## 2023-11-20 ENCOUNTER — Encounter: Payer: Self-pay | Admitting: Adult Health

## 2023-11-20 ENCOUNTER — Ambulatory Visit (INDEPENDENT_AMBULATORY_CARE_PROVIDER_SITE_OTHER): Payer: Medicare Other | Admitting: Adult Health

## 2023-11-20 VITALS — BP 128/78 | HR 85 | Temp 97.5°F | Resp 18 | Ht 66.0 in | Wt 233.8 lb

## 2023-11-20 DIAGNOSIS — E66812 Obesity, class 2: Secondary | ICD-10-CM | POA: Diagnosis not present

## 2023-11-20 DIAGNOSIS — E782 Mixed hyperlipidemia: Secondary | ICD-10-CM

## 2023-11-20 DIAGNOSIS — Z6837 Body mass index (BMI) 37.0-37.9, adult: Secondary | ICD-10-CM

## 2023-11-20 DIAGNOSIS — Z789 Other specified health status: Secondary | ICD-10-CM

## 2023-11-20 DIAGNOSIS — R7303 Prediabetes: Secondary | ICD-10-CM | POA: Diagnosis not present

## 2023-11-20 DIAGNOSIS — R221 Localized swelling, mass and lump, neck: Secondary | ICD-10-CM

## 2023-11-20 NOTE — Progress Notes (Signed)
 [facility] clinic  Provider:   Jereld Serum DNP  Code Status:  Full Code  Goals of Care:     11/20/2023   11:01 AM  Advanced Directives  Does Patient Have a Medical Advance Directive? No  Would patient like information on creating a medical advance directive? No - Patient declined     Chief Complaint  Patient presents with   Medical Management of Chronic Issues    2 week follow-up   Immunizations    Influenza, Shingrix, Pneumonia and Covid   Health Maintenance    Medicare Annual Wellness Visit    HPI: Patient is a 68 y.o. male seen today for a 2-week follow up of chronic medical issues.  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 No pertinent surgical history.  No Known Allergies  Outpatient Encounter Medications as of 11/20/2023  Medication Sig   aspirin EC 81 MG tablet Take 81 mg by mouth daily.   calcium  carbonate (OSCAL) 1500 (600 Ca) MG TABS tablet Take 600 mg by mouth 2 (two) times daily with a meal.   cyanocobalamin  (VITAMIN B12) 1000 MCG tablet Take 1 tablet (1,000 mcg total) by mouth daily.   Multiple Vitamins-Minerals (MULTIVITAMIN WITH MINERALS) tablet Take 1 tablet by mouth daily.   Omega-3 Fatty Acids (FISH OIL PO) Take 1 capsule by mouth daily.   folic acid  (FOLVITE ) 1 MG tablet Take 1 tablet (1 mg total) by mouth daily. (Patient not taking: Reported on 05/05/2023)   No facility-administered encounter medications on file as of 11/20/2023.     Review of Systems:  Review of Systems  Constitutional:  Negative for activity change, appetite change and fever.  HENT:  Negative for sore throat.   Eyes: Negative.   Cardiovascular:  Negative for chest pain and leg swelling.  Gastrointestinal:  Negative for abdominal distention, diarrhea and vomiting.  Genitourinary:  Negative for dysuria, frequency and urgency.  Skin:  Negative for color change.  Neurological:  Negative for dizziness and headaches.  Psychiatric/Behavioral:  Negative for behavioral problems and sleep disturbance. The patient is not nervous/anxious.     Health Maintenance  Topic Date Due   Medicare Annual Wellness (AWV)  Never done   Pneumonia Vaccine 52+ Years old (1 of 2 - PCV) Never done   Zoster Vaccines- Shingrix (1 of 2) Never done   INFLUENZA VACCINE  Never done   COVID-19 Vaccine (3 - 2024-25 season) 07/20/2023   Colonoscopy  05/28/2024 (Originally 01/01/2003)   Hepatitis C Screening  Completed   HIV Screening  Addressed   HPV VACCINES  Aged Out   DTaP/Tdap/Td  Discontinued    Physical Exam: Vitals:   11/20/23 1101  BP: 128/78  Pulse: 85  Resp: 18  Temp: (!) 97.5 F (36.4 C)  SpO2: 97%  Weight: 233 lb 12.8 oz (106.1 kg)  Height: 5' 6 (1.676 m)   Body mass index is 37.74 kg/m. Physical Exam Constitutional:  General: He is not in acute distress.    Appearance: He is obese.  HENT:     Head: Normocephalic and atraumatic.     Mouth/Throat:     Mouth: Mucous membranes are moist.  Eyes:     Conjunctiva/sclera: Conjunctivae normal.  Cardiovascular:     Rate and Rhythm: Normal rate and regular rhythm.     Pulses: Normal pulses.     Heart sounds: Normal heart sounds.  Pulmonary:     Effort: Pulmonary effort is normal.     Breath sounds: Normal breath sounds.  Abdominal:     General: Bowel sounds are normal.     Palpations: Abdomen is soft.  Musculoskeletal:        General: No swelling. Normal range of motion.     Cervical back:  Normal range of motion.  Skin:    General: Skin is warm and dry.  Neurological:     General: No focal deficit present.     Mental Status: He is alert and oriented to person, place, and time.  Psychiatric:        Mood and Affect: Mood normal.        Behavior: Behavior normal.        Thought Content: Thought content normal.        Judgment: Judgment normal.     Labs reviewed: Basic Metabolic Panel: Recent Labs    11/20/22 1353 05/05/23 1014 10/31/23 1028  NA 140 140 141  K 4.7 4.0 5.1  CL 102 104 105  CO2 27 24 27   GLUCOSE 106* 110* 101*  BUN 13 16 18   CREATININE 0.94 1.19 1.01  CALCIUM  9.9 9.6 10.8*  TSH 2.28  --   --    Liver Function Tests: Recent Labs    11/20/22 1353 10/31/23 1028  AST 35 24  ALT 50 25  ALKPHOS 68  --   BILITOT 0.8 0.3  PROT 7.5 7.8  ALBUMIN 4.5  --    No results for input(s): LIPASE, AMYLASE in the last 8760 hours. No results for input(s): AMMONIA in the last 8760 hours. CBC: Recent Labs    10/31/23 1028  WBC 9.8  NEUTROABS 4,949  HGB 17.2*  HCT 52.4*  MCV 96.0  PLT 256   Lipid Panel: Recent Labs    10/31/23 1028  CHOL 211*  HDL 63  LDLCALC 128*  TRIG 97  CHOLHDL 3.3   Lab Results  Component Value Date   HGBA1C 5.9 (H) 10/31/2023    Procedures since last visit: No results found.  Assessment/Plan  1. Mixed hyperlipidemia (Primary) Lab Results  Component Value Date   CHOL 211 (H) 10/31/2023   HDL 63 10/31/2023   LDLCALC 128 (H) 10/31/2023   LDLDIRECT 104.0 08/17/2021   TRIG 97 10/31/2023   CHOLHDL 3.3 10/31/2023    -  plans to increase vegetable intake and exercise -  repeat lipid panel in 3 months  2. Prediabetes Lab Results  Component Value Date   HGBA1C 5.9 (H) 10/31/2023    -  diet-controlled - repeat A1C in 3 months  3. Class 2 obesity with body mass index (BMI) of 37.0 to 37.9 in adult, unspecified obesity type, unspecified whether serious comorbidity present -  will do diet and  exercise -  wants to lose 10 lbs in 3 months  4. Serum calcium  elevated -  stopped taking calcium  2 weeks ago -  repeat CMP in 3 months  5. Submental mass -  referred to ENT  6. Alcohol use -  drinks a galloon of alcohol in a month -  counseled   Labs/tests ordered:    Next appt:  Visit date not found

## 2023-11-25 ENCOUNTER — Encounter (INDEPENDENT_AMBULATORY_CARE_PROVIDER_SITE_OTHER): Payer: Self-pay | Admitting: Otolaryngology

## 2023-12-05 ENCOUNTER — Telehealth (INDEPENDENT_AMBULATORY_CARE_PROVIDER_SITE_OTHER): Payer: Self-pay | Admitting: Otolaryngology

## 2023-12-05 NOTE — Telephone Encounter (Signed)
Reminder Call: Date: 12/08/2023 Status: Sch  Time: 9:15 AM [address] [place], [place] [address]  Left Voicemail

## 2023-12-08 ENCOUNTER — Ambulatory Visit (INDEPENDENT_AMBULATORY_CARE_PROVIDER_SITE_OTHER): Payer: Medicare Other | Admitting: Otolaryngology

## 2023-12-08 ENCOUNTER — Encounter (INDEPENDENT_AMBULATORY_CARE_PROVIDER_SITE_OTHER): Payer: Self-pay

## 2023-12-08 VITALS — BP 153/97 | HR 100 | Resp 19 | Ht 66.0 in | Wt 225.0 lb

## 2023-12-08 DIAGNOSIS — R221 Localized swelling, mass and lump, neck: Secondary | ICD-10-CM

## 2023-12-08 NOTE — Patient Instructions (Signed)
I have ordered an imaging study for you to complete prior to your next visit. Please call [facility] Scheduling at [phone] to schedule your imaging if you have not received a call within 24 hours. If you are unable to complete your imaging study prior to your next scheduled visit please call our office to let us know.
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
## 2023-12-19 ENCOUNTER — Ambulatory Visit: Payer: Medicaid Other | Admitting: Adult Health

## 2023-12-23 ENCOUNTER — Ambulatory Visit (HOSPITAL_COMMUNITY)
Admission: RE | Admit: 2023-12-23 | Discharge: 2023-12-23 | Disposition: A | Discharge status: Home / Self Care | Payer: Medicare Other | Source: Ambulatory Visit | Attending: Otolaryngology | Admitting: Otolaryngology

## 2023-12-23 DIAGNOSIS — R221 Localized swelling, mass and lump, neck: Secondary | ICD-10-CM | POA: Diagnosis present

## 2023-12-23 MED ORDER — IOHEXOL 350 MG/ML SOLN
75.0000 mL | Freq: Once | INTRAVENOUS | Status: AC | PRN
  Administered 2023-12-23: 75 mL via INTRAVENOUS

## 2023-12-26 ENCOUNTER — Encounter: Payer: No Typology Code available for payment source | Admitting: Adult Health

## 2023-12-29 ENCOUNTER — Ambulatory Visit (INDEPENDENT_AMBULATORY_CARE_PROVIDER_SITE_OTHER): Payer: No Typology Code available for payment source | Admitting: Adult Health

## 2023-12-29 ENCOUNTER — Encounter: Payer: Self-pay | Admitting: Adult Health

## 2023-12-29 VITALS — BP 116/78 | HR 93 | Temp 96.7°F | Resp 18 | Ht 66.0 in | Wt 231.6 lb

## 2023-12-29 DIAGNOSIS — Z6836 Body mass index (BMI) 36.0-36.9, adult: Secondary | ICD-10-CM | POA: Diagnosis not present

## 2023-12-29 DIAGNOSIS — E66812 Obesity, class 2: Secondary | ICD-10-CM | POA: Diagnosis not present

## 2023-12-29 DIAGNOSIS — R1901 Right upper quadrant abdominal swelling, mass and lump: Secondary | ICD-10-CM

## 2023-12-29 DIAGNOSIS — R221 Localized swelling, mass and lump, neck: Secondary | ICD-10-CM | POA: Diagnosis not present

## 2023-12-29 DIAGNOSIS — R7303 Prediabetes: Secondary | ICD-10-CM

## 2023-12-29 DIAGNOSIS — Z720 Tobacco use: Secondary | ICD-10-CM

## 2023-12-29 DIAGNOSIS — E782 Mixed hyperlipidemia: Secondary | ICD-10-CM | POA: Diagnosis not present

## 2023-12-29 NOTE — Progress Notes (Signed)
 [facility] clinic  Provider:  Inge Mangle DNP  Code Status:  Full Code   Goals of Care:     11/20/2023   11:01 AM  Advanced Directives  Does Patient Have a Medical Advance Directive? No  Would patient like information on creating a medical advance directive? No - Patient declined     Chief Complaint  Patient presents with   Medical Management of Chronic Issues    4 week follow-up   Immunizations    Covid, Pneumonia, and Shingrix   Health Maintenance    Medicare Annual Wellness Visit   Discussed the use of AI scribe software for clinical note transcription with the patient, who gave verbal consent to proceed  HPI: Patient is a 68 y.o. male seen today for 4 week follow up of chronic medical issues.  He was recently found to be prediabetic with an A1c of 5.9. He is not on medication for this condition and is focusing on lifestyle modifications.  He has a submental mass and has consulted an ENT specialist. A CT scan was performed, and he is awaiting a follow-up appointment on February 20 to discuss the results.  He has a mass on the right side below the ribcage, present for seven to eight years, which he associates with heavy lifting. The mass has been painful for the past six to seven months. An x-ray from 2018 showed a questionable bone mass, and further imaging is planned.  He has a history of elevated cholesterol, with a recent level of 211 mg/dL, down from 454 mg/dL. He has declined statin therapy due to a past adverse reaction causing bleeding and prefers to manage his cholesterol through diet and exercise.  He has a significant smoking history, smoking a pack a day for fifty years, with occasional days of reduced smoking due to feeling 'too much nicotine'.  He is currently taking aspirin and vitamin B12, but has stopped taking calcium  and folic acid  supplements seven months ago, coinciding with the cessation of bleeding.    Past Medical History:  Diagnosis Date    HTN, goal below 140/80 01/08/2017   Nicotine use disorder 01/08/2017   Obesity (BMI 30-39.9) 01/08/2017   PTSD (post-traumatic stress disorder)    Reactive depression 01/08/2017    History reviewed. No pertinent surgical history.  No Known Allergies  Outpatient Encounter Medications as of 12/29/2023  Medication Sig   aspirin EC 81 MG tablet Take 81 mg by mouth daily.   cyanocobalamin  (VITAMIN B12) 1000 MCG tablet Take 1 tablet (1,000 mcg total) by mouth daily.   Multiple Vitamins-Minerals (MULTIVITAMIN WITH MINERALS) tablet Take 1 tablet by mouth daily.   Omega-3 Fatty Acids (FISH OIL PO) Take 1 capsule by mouth daily.   calcium  carbonate (OSCAL) 1500 (600 Ca) MG TABS tablet Take 600 mg by mouth 2 (two) times daily with a meal. (Patient not taking: Reported on 12/29/2023)   folic acid  (FOLVITE ) 1 MG tablet Take 1 tablet (1 mg total) by mouth daily. (Patient not taking: Reported on 12/29/2023)   No facility-administered encounter medications on file as of 12/29/2023.    Review of Systems:  Review of Systems  Constitutional:  Negative for activity change, appetite change and fever.  HENT:  Negative for sore throat.   Eyes: Negative.   Cardiovascular:  Negative for chest pain and leg swelling.  Gastrointestinal:  Negative for abdominal distention, diarrhea and vomiting.  Genitourinary:  Negative for dysuria, frequency and urgency.  Skin:  Negative for color change.  Neurological:  Negative for dizziness and headaches.  Psychiatric/Behavioral:  Negative for behavioral problems and sleep disturbance. The patient is not nervous/anxious.     Health Maintenance  Topic Date Due   Medicare Annual Wellness (AWV)  Never done   Pneumonia Vaccine 30+ Years old (1 of 2 - PCV) Never done   Zoster Vaccines- Shingrix (1 of 2) Never done   COVID-19 Vaccine (3 - 2024-25 season) 07/20/2023   INFLUENZA VACCINE  02/16/2024 (Originally 06/19/2023)   Colonoscopy  05/28/2024 (Originally 01/01/2003)    Hepatitis C Screening  Completed   HIV Screening  Addressed   HPV VACCINES  Aged Out   DTaP/Tdap/Td  Discontinued    Physical Exam: Vitals:   12/29/23 1037  Height: 5\' 6"  (1.676 m)   Body mass index is 36.32 kg/m. Physical Exam Constitutional:      Appearance: He is obese.  HENT:     Head: Normocephalic and atraumatic.     Mouth/Throat:     Mouth: Mucous membranes are moist.  Eyes:     Conjunctiva/sclera: Conjunctivae normal.  Cardiovascular:     Rate and Rhythm: Normal rate and regular rhythm.     Pulses: Normal pulses.     Heart sounds: Normal heart sounds.  Pulmonary:     Effort: Pulmonary effort is normal.     Breath sounds: Normal breath sounds.  Abdominal:     General: Bowel sounds are normal.     Palpations: Abdomen is soft.  Musculoskeletal:        General: No swelling. Normal range of motion.     Cervical back: Normal range of motion.  Skin:    General: Skin is warm and dry.     Comments: Right rib cage mass?  Neurological:     General: No focal deficit present.     Mental Status: He is alert and oriented to person, place, and time.  Psychiatric:        Mood and Affect: Mood normal.        Behavior: Behavior normal.        Thought Content: Thought content normal.        Judgment: Judgment normal.     Labs reviewed: Basic Metabolic Panel: Recent Labs    05/05/23 1014 10/31/23 1028  NA 140 141  K 4.0 5.1  CL 104 105  CO2 24 27  GLUCOSE 110* 101*  BUN 16 18  CREATININE 1.19 1.01  CALCIUM  9.6 10.8*   Liver Function Tests: Recent Labs    10/31/23 1028  AST 24  ALT 25  BILITOT 0.3  PROT 7.8   No results for input(s): "LIPASE", "AMYLASE" in the last 8760 hours. No results for input(s): "AMMONIA" in the last 8760 hours. CBC: Recent Labs    10/31/23 1028  WBC 9.8  NEUTROABS 4,949  HGB 17.2*  HCT 52.4*  MCV 96.0  PLT 256   Lipid Panel: Recent Labs    10/31/23 1028  CHOL 211*  HDL 63  LDLCALC 128*  TRIG 97  CHOLHDL 3.3    Lab Results  Component Value Date   HGBA1C 5.9 (H) 10/31/2023    Procedures since last visit: No results found.  Assessment/Plan  1. Prediabetes (Primary) -  A1c of 5.9. Discussed lifestyle modifications including low carb diet and exercise for at least 150 minutes per week. -Repeat A1c in 3 months.  2. Submental mass -  Patient has seen ENT and had a CT scan. Follow-up appointment with ENT is scheduled for  February 20. -Follow-up with ENT on February 20 to review CT scan results.  3. Abdominal mass, RUQ (right upper quadrant) -  Mass present for 7-8 years, possibly related to heavy lifting incident. Previous imaging from 2018 suggested possible bone mass.  4. Tobacco abuse -  counseled Patient reports smoking approximately 1 pack per day for 50 years. -Order CT scan of the lungs for lung cancer screening.  5. Mixed hyperlipidemia Lab Results  Component Value Date   CHOL 211 (H) 10/31/2023   HDL 63 10/31/2023   LDLCALC 128 (H) 10/31/2023   LDLDIRECT 104.0 08/17/2021   TRIG 97 10/31/2023   CHOLHDL 3.3 10/31/2023    -  Total cholesterol of 211, LDL of 128. Patient declined statin therapy. -Encouraged lifestyle modifications including increased vegetable intake and exercise.  6. Serum calcium  elevated Lab Results  Component Value Date   CALCIUM  10.8 (H) 10/31/2023    -Check calcium  levels today due to previous elevation. - Basic Metabolic Panel with eGFR  7. Class 2 obesity with body mass index (BMI) of 36.0 to 36.9 in adult, unspecified obesity type, unspecified whether serious comorbidity present -  counseled on diet and exercise     Follow-up in 1 month.      Labs/tests ordered:  BMP and CT lungs low dose   Faustino Luecke Medina-Vargas, NP

## 2023-12-29 NOTE — Progress Notes (Signed)
 No show  This encounter was created in error - please disregard.

## 2023-12-30 LAB — BASIC METABOLIC PANEL WITH GFR
BUN: 9 mg/dL (ref 7–25)
CO2: 28 mmol/L (ref 20–32)
Calcium: 10 mg/dL (ref 8.6–10.3)
Chloride: 106 mmol/L (ref 98–110)
Creat: 0.94 mg/dL (ref 0.70–1.35)
Glucose, Bld: 92 mg/dL (ref 65–139)
Potassium: 5.1 mmol/L (ref 3.5–5.3)
Sodium: 142 mmol/L (ref 135–146)
eGFR: 90 mL/min/{1.73_m2} (ref >=60)

## 2023-12-30 NOTE — Progress Notes (Signed)
-    calcium level now normal -  electrolytes all within normal

## 2024-01-08 ENCOUNTER — Ambulatory Visit (INDEPENDENT_AMBULATORY_CARE_PROVIDER_SITE_OTHER): Payer: No Typology Code available for payment source | Admitting: Otolaryngology

## 2024-01-08 ENCOUNTER — Encounter (INDEPENDENT_AMBULATORY_CARE_PROVIDER_SITE_OTHER): Payer: Self-pay

## 2024-01-08 VITALS — BP 167/116 | HR 77

## 2024-01-08 DIAGNOSIS — R221 Localized swelling, mass and lump, neck: Secondary | ICD-10-CM | POA: Diagnosis not present
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
## 2024-01-12 DIAGNOSIS — H5203 Hypermetropia, bilateral: Secondary | ICD-10-CM | POA: Diagnosis not present

## 2024-01-20 ENCOUNTER — Encounter (HOSPITAL_COMMUNITY): Payer: Self-pay

## 2024-01-20 NOTE — Progress Notes (Signed)
 Surgical Instructions   Your procedure is scheduled on Wednesday, January 28, 2024. Report to [facility] Main Entrance "A" at 2 P.M.  then check in with the Admitting office. Any questions or running late day of surgery: call [phone]  Questions prior to your surgery date: call [phone], Monday-Friday, 8am-4pm. If you experience any cold or flu symptoms such as cough, fever, chills, shortness of breath, etc. between now and your scheduled surgery, please notify us at the above number.     Remember:  Do not eat after midnight the night before your surgery   You may drink clear liquids until 1 P.M. the day of your surgery.   Clear liquids allowed are: Water, Non-Citrus Juices (without pulp), Carbonated Beverages, Clear Tea (no milk, honey, etc.), Black Coffee Only (NO MILK, CREAM OR POWDERED CREAMER of any kind), and Gatorade.    Take these medicines the morning of surgery with A SIP OF WATER: None  Follow your surgeon's instructions on when to stop Asprin.  If no instructions were given by your surgeon then you will need to call the office to get those instructions.     One week prior to surgery, STOP taking any Aleve, Naproxen, Ibuprofen, Motrin, Advil, Goody's, BC's, all herbal medications, fish oil, and non-prescription vitamins.                     Do NOT Smoke (Tobacco/Vaping) for 24 hours prior to your procedure.  If you use a CPAP at night, you may bring your mask/headgear for your overnight stay.   You will be asked to remove any contacts, glasses, piercing's, hearing aid's, dentures/partials prior to surgery. Please bring cases for these items if needed.    Patients discharged the day of surgery will not be allowed to drive home, and someone needs to stay with them for 24 hours.  SURGICAL WAITING ROOM VISITATION Patients may have no more than 2 support people in the waiting area - these visitors may rotate.   Pre-op nurse will coordinate an appropriate time for 1  ADULT support person, who may not rotate, to accompany patient in pre-op.  Children under the age of 41 must have an adult with them who is not the patient and must remain in the main waiting area with an adult.  If the patient needs to stay at the hospital during part of their recovery, the visitor guidelines for inpatient rooms apply.  Please refer to the [facility] website for the visitor guidelines for any additional information.   If you received a COVID test during your pre-op visit  it is requested that you wear a mask when out in public, stay away from anyone that may not be feeling well and notify your surgeon if you develop symptoms. If you have been in contact with anyone that has tested positive in the last 10 days please notify you surgeon.      Pre-operative CHG Bathing Instructions   You can play a key role in reducing the risk of infection after surgery. Your skin needs to be as free of germs as possible. You can reduce the number of germs on your skin by washing with CHG (chlorhexidine gluconate) soap before surgery. CHG is an antiseptic soap that kills germs and continues to kill germs even after washing.   DO NOT use if you have an allergy to chlorhexidine/CHG or antibacterial soaps. If your skin becomes reddened or irritated, stop using the CHG and notify one of our  RNs at [phone].              TAKE A SHOWER THE NIGHT BEFORE SURGERY AND THE DAY OF SURGERY    Please keep in mind the following:  DO NOT shave, including legs and underarms, 48 hours prior to surgery.   You may shave your face before/day of surgery.  Place clean sheets on your bed the night before surgery Use a clean washcloth (not used since being washed) for each shower. DO NOT sleep with pet's night before surgery.  CHG Shower Instructions:  Wash your face and private area with normal soap. If you choose to wash your hair, wash first with your normal shampoo.  After you use shampoo/soap, rinse  your hair and body thoroughly to remove shampoo/soap residue.  Turn the water OFF and apply half the bottle of CHG soap to a CLEAN washcloth.  Apply CHG soap ONLY FROM YOUR NECK DOWN TO YOUR TOES (washing for 3-5 minutes)  DO NOT use CHG soap on face, private areas, open wounds, or sores.  Pay special attention to the area where your surgery is being performed.  If you are having back surgery, having someone wash your back for you may be helpful. Wait 2 minutes after CHG soap is applied, then you may rinse off the CHG soap.  Pat dry with a clean towel  Put on clean pajamas    Additional instructions for the day of surgery: DO NOT APPLY any lotions, deodorants or cologne.    Do not wear jewelry Do not bring valuables to the hospital. [facility] is not responsible for valuables/personal belongings. Put on clean/comfortable clothes.  Please brush your teeth.  Ask your nurse before applying any prescription medications to the skin.

## 2024-01-21 ENCOUNTER — Inpatient Hospital Stay (HOSPITAL_COMMUNITY)
Admission: RE | Admit: 2024-01-21 | Discharge: 2024-01-21 | Disposition: A | Discharge status: Home / Self Care | Source: Ambulatory Visit

## 2024-01-21 HISTORY — DX: Other psychoactive substance abuse, uncomplicated: F19.10

## 2024-01-23 ENCOUNTER — Encounter (HOSPITAL_COMMUNITY)
Admission: RE | Admit: 2024-01-23 | Discharge: 2024-01-23 | Disposition: A | Discharge status: Home / Self Care | Source: Ambulatory Visit | Attending: Otolaryngology | Admitting: Otolaryngology

## 2024-01-23 ENCOUNTER — Encounter (HOSPITAL_COMMUNITY): Payer: Self-pay

## 2024-01-23 ENCOUNTER — Other Ambulatory Visit: Payer: Self-pay

## 2024-01-23 VITALS — BP 157/110 | HR 94 | Temp 97.6°F | Resp 17 | Ht 66.0 in | Wt 229.5 lb

## 2024-01-23 DIAGNOSIS — F1020 Alcohol dependence, uncomplicated: Secondary | ICD-10-CM | POA: Diagnosis not present

## 2024-01-23 DIAGNOSIS — I10 Essential (primary) hypertension: Secondary | ICD-10-CM | POA: Insufficient documentation

## 2024-01-23 DIAGNOSIS — F4310 Post-traumatic stress disorder, unspecified: Secondary | ICD-10-CM | POA: Diagnosis not present

## 2024-01-23 DIAGNOSIS — Z01818 Encounter for other preprocedural examination: Secondary | ICD-10-CM | POA: Diagnosis not present

## 2024-01-23 LAB — COMPREHENSIVE METABOLIC PANEL WITH GFR
ALT: 40 U/L (ref 0–44)
AST: 46 U/L — ABNORMAL HIGH (ref 15–41)
Albumin: 3.9 g/dL (ref 3.5–5.0)
Alkaline Phosphatase: 75 U/L (ref 38–126)
Anion gap: 9 (ref 5–15)
BUN: 8 mg/dL (ref 8–23)
CO2: 26 mmol/L (ref 22–32)
Calcium: 9.4 mg/dL (ref 8.9–10.3)
Chloride: 103 mmol/L (ref 98–111)
Creatinine, Ser: 0.96 mg/dL (ref 0.61–1.24)
GFR, Estimated: >60 mL/min
Glucose, Bld: 108 mg/dL — ABNORMAL HIGH (ref 70–99)
Potassium: 4.6 mmol/L (ref 3.5–5.1)
Sodium: 138 mmol/L (ref 135–145)
Total Bilirubin: 0.8 mg/dL (ref 0.0–1.2)
Total Protein: 7.2 g/dL (ref 6.5–8.1)

## 2024-01-23 LAB — CBC
HCT: 51.3 % (ref 39.0–52.0)
Hemoglobin: 17.5 g/dL — ABNORMAL HIGH (ref 13.0–17.0)
MCH: 32.6 pg (ref 26.0–34.0)
MCHC: 34.1 g/dL (ref 30.0–36.0)
MCV: 95.5 fL (ref 80.0–100.0)
Platelets: 263 10*3/uL (ref 150–400)
RBC: 5.37 MIL/uL (ref 4.22–5.81)
RDW: 13.7 % (ref 11.5–15.5)
WBC: 9.2 10*3/uL (ref 4.0–10.5)
nRBC: 0 % (ref 0.0–0.2)

## 2024-01-23 NOTE — Progress Notes (Signed)
   01/23/24 0826  OBSTRUCTIVE SLEEP APNEA  Have you ever been diagnosed with sleep apnea through a sleep study? No  Do you snore loudly (loud enough to be heard through closed doors)?  0  Do you often feel tired, fatigued, or sleepy during the daytime (such as falling asleep during driving or talking to someone)? 0  Has anyone observed you stop breathing during your sleep? 0  Do you have, or are you being treated for high blood pressure? 1  BMI more than 35 kg/m2? 1  Age > 50 (1-yes) 1  Neck circumference greater than:Male 16 inches or larger, Male 17inches or larger? 1  Male Gender (Yes=1) 1  Obstructive Sleep Apnea Score 5  Score 5 or greater  Results sent to PCP

## 2024-01-23 NOTE — Progress Notes (Signed)
 Anesthesia Chart Review:  68 year old male with pertinent history including HTN, EtOH abuse (reportedly drinks about a bottle of wine per day), PTSD, current smoker.  Hypertension not well-controlled at preadmission testing, blood pressure 157/110.  He was previously on lisinopril and amlodipine but he states he stopped these a few months ago as his blood pressure has been running normal.  Recent PCP visit is reviewed, and BP does appear well-controlled.  However, on recent visit with Dr. Allena Katz on 01/08/2024 his blood pressure was similarly elevated 167/116.  He was advised that markedly elevated blood pressure could be cause for cancellation on day of surgery.  He was recommended to take prescribed medications and follow-up with his PCP for further management.  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  CT soft tissue neck 12/23/2023 showed, "There is wasting of the infraglottic airway in a location suggesting prior tracheostomy, smallest level of air column measuring 17 x 9 mm."  Preop labs reviewed, unremarkable.  EKG 01/23/2024: NSR.  Rate 79.   [facility] Short Stay Center/Anesthesiology Phone [phone] 01/23/2024 1:15 PM

## 2024-01-23 NOTE — Progress Notes (Signed)
 PCP - Kenard Gower, NP Cardiologist - denies  PPM/ICD - denies   Chest x-ray - denies EKG - 01/23/24 Stress Test - denies ECHO - denies Cardiac Cath - denies  Sleep Study - denies   DM- denies  Last dose of GLP1 agonist-  n/a   Blood Thinner Instructions: n/a Aspirin Instructions: Hold 1 week- Last dose 3/6  ERAS Protcol - clears until 1300   COVID TEST- n/a   Anesthesia review: yes, high BP at PAT appt. 157/110. Pt states he no longer takes BP meds as of 6 months ago. He says that the BP meds caused him to have blood in his stool and that his PCP is aware that he stopped taking them. Pt was advised that he needs to contact his PCP to get started back on BP meds because it does not need to be that high when he undergoes anesthesia. Pt understood. Pt also states that he drinks 1 bottle of white wine per night. OSA score of 5 routed to PCP as well.  Patient denies shortness of breath, fever, cough and chest pain at PAT appointment   All instructions explained to the patient, with a verbal understanding of the material. Patient agrees to go over the instructions while at home for a better understanding.  The opportunity to ask questions was provided.

## 2024-01-26 ENCOUNTER — Ambulatory Visit (INDEPENDENT_AMBULATORY_CARE_PROVIDER_SITE_OTHER): Payer: Medicare Other | Admitting: Adult Health

## 2024-01-26 ENCOUNTER — Encounter: Payer: Self-pay | Admitting: Adult Health

## 2024-01-26 VITALS — BP 128/88 | HR 82 | Temp 96.8°F | Resp 18 | Ht 66.0 in | Wt 232.2 lb

## 2024-01-26 DIAGNOSIS — Z789 Other specified health status: Secondary | ICD-10-CM | POA: Diagnosis not present

## 2024-01-26 DIAGNOSIS — R221 Localized swelling, mass and lump, neck: Secondary | ICD-10-CM | POA: Diagnosis not present

## 2024-01-26 DIAGNOSIS — R7303 Prediabetes: Secondary | ICD-10-CM | POA: Diagnosis not present

## 2024-01-26 DIAGNOSIS — F5105 Insomnia due to other mental disorder: Secondary | ICD-10-CM | POA: Diagnosis not present

## 2024-01-26 DIAGNOSIS — Z6837 Body mass index (BMI) 37.0-37.9, adult: Secondary | ICD-10-CM

## 2024-01-26 DIAGNOSIS — Z720 Tobacco use: Secondary | ICD-10-CM | POA: Diagnosis not present

## 2024-01-26 DIAGNOSIS — F99 Mental disorder, not otherwise specified: Secondary | ICD-10-CM | POA: Diagnosis not present

## 2024-01-26 DIAGNOSIS — E782 Mixed hyperlipidemia: Secondary | ICD-10-CM | POA: Diagnosis not present

## 2024-01-26 DIAGNOSIS — E66812 Obesity, class 2: Secondary | ICD-10-CM | POA: Diagnosis not present

## 2024-01-26 MED ORDER — ATORVASTATIN CALCIUM 20 MG PO TABS: 20.0000 mg | ORAL_TABLET | Freq: Every day | ORAL | 3 refills | Status: AC

## 2024-01-26 MED ORDER — ALPRAZOLAM 0.5 MG PO TABS: 0.5000 mg | ORAL_TABLET | Freq: Every evening | ORAL | 0 refills | Status: DC | PRN

## 2024-01-26 NOTE — Progress Notes (Unsigned)
 [facility] clinic  Provider:  Kenard Gower DNP  Code Status:  Full Code  Goals of Care:     01/23/2024    8:29 AM  Advanced Directives  Does Patient Have a Medical Advance Directive? No  Would patient like information on creating a medical advance directive? No - Patient declined     Chief Complaint  Patient presents with   Medical Management of Chronic Issues    4 weeks    Immunizations    Covid, Pneumonia, and Shingrix. NCIR verified.   Health Maintenance    Medicare Annual Wellness Visit   Discussed the use of AI scribe software for clinical note transcription with the patient, who gave verbal consent to proceed.  HPI: Patient is a 68 y.o. male seen today for a 4 week follow up of chronic medical issues.  He experiences significant insomnia, stating he 'can't sleep at all' and attributes this to anxiety and stress following multiple family losses over the past seven years, including the deaths of his son, mother, father, and granddaughter. He uses alcohol to aid sleep, consuming about a gallon weekly. He is not currently on any medication for sleep.  He has a history of mixed hyperlipidemia and is not currently on medication for cholesterol. His cholesterol levels have decreased from 228 to 211 since December, with triglycerides normal and LDL at 128.  He is prediabetic with an A1c of 5.9 and reports exercising daily, walking approximately 2,000 steps a day. He is retired and describes his weight as steady, fluctuating slightly around 232 pounds, which classifies him as obese.  He smokes a pack of cigarettes a day and has a CT scan scheduled to screen for lung cancer due to his smoking history.   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 He stated that the mass  has been present for approximately 20 years.  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   cyanocobalamin (VITAMIN B12) 1000 MCG tablet Take 1 tablet (1,000 mcg total) by mouth daily.   Multiple Vitamins-Minerals (MULTIVITAMIN WITH MINERALS) tablet Take 1 tablet by mouth every other day.   Omega-3 Fatty Acids (FISH OIL PO) Take 1 capsule by mouth in the morning.   No facility-administered encounter medications on file as of 01/26/2024.    Review of Systems:  Review of Systems  Constitutional:  Negative for activity change, appetite change and fever.  HENT:  Negative for sore throat.   Eyes: Negative.   Cardiovascular:  Negative for chest pain and leg swelling.  Gastrointestinal:  Negative for abdominal distention, diarrhea and vomiting.  Genitourinary:  Negative for dysuria, frequency and urgency.  Skin:  Negative for color change.  Neurological:  Negative for dizziness and headaches.  Psychiatric/Behavioral:  Positive for sleep disturbance. Negative for behavioral problems. The patient is not nervous/anxious.     Health Maintenance  Topic Date Due   Medicare Annual Wellness (AWV)  Never done  Pneumonia Vaccine 29+ Years old (1 of 2 - PCV) Never done   Zoster Vaccines- Shingrix (1 of 2) Never done   COVID-19 Vaccine (3 - 2024-25 season) 07/20/2023   INFLUENZA VACCINE  02/16/2024 (Originally 06/19/2023)   Colonoscopy  05/28/2024 (Originally 01/01/2003)   Hepatitis C Screening  Completed   HPV VACCINES  Aged Out   DTaP/Tdap/Td  Discontinued    Physical Exam: Vitals:   01/26/24 1043  BP: 128/88  Pulse: 82  Resp: 18  Temp: (!) 96.8 F (36 C)  SpO2: 98%  Weight: 232 lb 3.2 oz (105.3 kg)  Height: 5'  6" (1.676 m)   Body mass index is 37.48 kg/m. Physical Exam Constitutional:      General: He is not in acute distress.    Appearance: He is obese.  HENT:     Head: Normocephalic and atraumatic.     Mouth/Throat:     Mouth: Mucous membranes are moist.  Eyes:     Conjunctiva/sclera: Conjunctivae normal.  Cardiovascular:     Rate and Rhythm: Normal rate and regular rhythm.     Pulses: Normal pulses.     Heart sounds: Normal heart sounds.  Pulmonary:     Effort: Pulmonary effort is normal.     Breath sounds: Normal breath sounds.  Abdominal:     General: Bowel sounds are normal.     Palpations: Abdomen is soft.  Musculoskeletal:        General: No swelling. Normal range of motion.     Cervical back: Normal range of motion.  Skin:    General: Skin is warm and dry.  Neurological:     General: No focal deficit present.     Mental Status: He is alert and oriented to person, place, and time.  Psychiatric:        Mood and Affect: Mood normal.        Behavior: Behavior normal.        Thought Content: Thought content normal.        Judgment: Judgment normal.     Labs reviewed: Basic Metabolic Panel: Recent Labs    10/31/23 1028 12/29/23 1115 01/23/24 0834  NA 141 142 138  K 5.1 5.1 4.6  CL 105 106 103  CO2 27 28 26   GLUCOSE 101* 92 108*  BUN 18 9 8   CREATININE 1.01 0.94 0.96  CALCIUM 10.8* 10.0 9.4   Liver Function Tests: Recent Labs    10/31/23 1028 01/23/24 0834  AST 24 46*  ALT 25 40  ALKPHOS  --  75  BILITOT 0.3 0.8  PROT 7.8 7.2  ALBUMIN  --  3.9   No results for input(s): "LIPASE", "AMYLASE" in the last 8760 hours. No results for input(s): "AMMONIA" in the last 8760 hours. CBC: Recent Labs    10/31/23 1028 01/23/24 0834  WBC 9.8 9.2  NEUTROABS 4,949  --   HGB 17.2* 17.5*  HCT 52.4* 51.3  MCV 96.0 95.5  PLT 256 263   Lipid Panel: Recent Labs    10/31/23 1028  CHOL 211*  HDL 63  LDLCALC 128*  TRIG 97  CHOLHDL 3.3   Lab Results   Component Value Date   HGBA1C 5.9 (H) 10/31/2023    Procedures since last visit: No results found.  Assessment/Plan  1. 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 Advised against using alcohol for sleep. Prescribed alprazolam with cautionary advice. - Prescribe alprazolam 0.5 mg  at bedtime as needed for anxiety and insomnia. - Instruct to avoid alcohol while taking alprazolam. - Sign a controlled substance contract for alprazolam. - Monitor alprazolam use with potential urine checks. - ALPRAZolam (XANAX) 0.5 MG tablet; Take 1 tablet (0.5 mg total) by mouth at bedtime as needed for anxiety.  Dispense: 30 tablet; Refill: 0  2. Alcohol use -  counseled  3. Mixed hyperlipidemia -  Total cholesterol 211 mg/dL, LDL 161 mg/dL.  - Start atorvastatin 20 mg daily. - atorvastatin (LIPITOR) 20 MG tablet; Take 1 tablet (20 mg total) by mouth daily.  Dispense: 90 tablet; Refill: 3  4. Prediabetes Lab Results  Component Value Date   HGBA1C 5.9 (H) 10/31/2023    -  HbA1c 5.9%. Encouraged exercise for blood sugar management. - Encourage continued daily exercise.  5. Tobacco abuse -  has a mass on the upper midabdomen -  Smokes a pack per day. Ordered CT scan for lung cancer screening. - Order CT scan of the chest for lung cancer screening.   6. Submental mass -  Scheduled for excision on January 28, 2024. - Proceed with excision on January 28, 2024.  7.  Class 2 Obesity with BMI 37.48 -  Obese at 232 lbs. Recommended lifestyle modifications. - Encourage healthy eating and continued exercise.     Labs/tests ordered:   None   No follow-ups on file.  Kenard Gower, NP

## 2024-01-27 NOTE — Anesthesia Preprocedure Evaluation (Signed)
 Anesthesia Evaluation  Patient identified by MRN, date of birth, ID band Patient awake    Reviewed: Allergy & Precautions, NPO status , Patient's Chart, lab work & pertinent test results, reviewed documented beta blocker date and time   History of Anesthesia Complications Negative for: history of anesthetic complications  Airway Mallampati: II  TM Distance: >3 FB Neck ROM: Full    Dental  (+) Edentulous Upper, Edentulous Lower   Pulmonary neg shortness of breath, neg COPD, Current Smoker Trach as child for ?flu   breath sounds clear to auscultation       Cardiovascular hypertension, (-) angina (-) CAD, (-) Past MI and (-) Cardiac Stents  Rhythm:Regular Rate:Normal     Neuro/Psych neg Seizures PSYCHIATRIC DISORDERS Anxiety Depression       GI/Hepatic ,neg GERD  ,,(+) neg Cirrhosis    substance abuse  alcohol use  Endo/Other    Renal/GU Renal disease     Musculoskeletal   Abdominal   Peds  Hematology   Anesthesia Other Findings   Reproductive/Obstetrics                             Anesthesia Physical Anesthesia Plan  ASA: 2  Anesthesia Plan: General   Post-op Pain Management:    Induction: Intravenous  PONV Risk Score and Plan: 2 and Ondansetron and Dexamethasone  Airway Management Planned: Oral ETT  Additional Equipment:   Intra-op Plan:   Post-operative Plan: Extubation in OR  Informed Consent: I have reviewed the patients History and Physical, chart, labs and discussed the procedure including the risks, benefits and alternatives for the proposed anesthesia with the patient or authorized representative who has indicated his/her understanding and acceptance.     Dental advisory given  Plan Discussed with: CRNA  Anesthesia Plan Comments: (PAT note by Antionette Poles, PA-C: 68 year old male with pertinent history including HTN, EtOH abuse (reportedly drinks about a bottle of  wine per day), PTSD, current smoker.  Hypertension not well-controlled at preadmission testing, blood pressure 157/110.  He was previously on lisinopril and amlodipine but he states he stopped these a few months ago as his blood pressure has been running normal.  Recent PCP visit is reviewed, and BP does appear well-controlled.  However, on recent visit with Dr. Allena Katz on 01/08/2024 his blood pressure was similarly elevated 167/116.  He was advised that markedly elevated blood pressure could be cause for cancellation on day of surgery.  He was recommended to take prescribed medications and follow-up with his PCP for further management.  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  CT soft tissue neck 12/23/2023 showed, "There is wasting of the infraglottic airway in a location suggesting prior tracheostomy, smallest level of air column measuring 17 x 9 mm."  Preop labs reviewed, unremarkable.  EKG 01/23/2024: NSR.  Rate 79.  )        Anesthesia Quick Evaluation

## 2024-01-28 ENCOUNTER — Other Ambulatory Visit: Payer: Self-pay

## 2024-01-28 ENCOUNTER — Ambulatory Visit (HOSPITAL_COMMUNITY): Payer: Self-pay | Admitting: Anesthesiology

## 2024-01-28 ENCOUNTER — Ambulatory Visit (HOSPITAL_COMMUNITY)
Admission: RE | Admit: 2024-01-28 | Discharge: 2024-01-28 | Disposition: A | Discharge status: Home / Self Care | Payer: No Typology Code available for payment source | Attending: Otolaryngology | Admitting: Otolaryngology

## 2024-01-28 ENCOUNTER — Encounter (HOSPITAL_COMMUNITY)
Admission: RE | Disposition: A | Discharge status: Home / Self Care | Payer: Self-pay | Source: Home / Self Care | Attending: Otolaryngology

## 2024-01-28 ENCOUNTER — Encounter (HOSPITAL_COMMUNITY): Payer: Self-pay | Admitting: *Deleted

## 2024-01-28 DIAGNOSIS — Z5320 Procedure and treatment not carried out because of patient's decision for unspecified reasons: Secondary | ICD-10-CM | POA: Insufficient documentation

## 2024-01-28 DIAGNOSIS — R221 Localized swelling, mass and lump, neck: Secondary | ICD-10-CM | POA: Diagnosis present

## 2024-01-28 SURGERY — EXCISION, MASS, NECK
Anesthesia: General | Site: Neck

## 2024-01-28 MED ORDER — LACTATED RINGERS IV SOLN: INTRAVENOUS | Status: DC

## 2024-01-28 MED ORDER — CHLORHEXIDINE GLUCONATE 0.12 % MT SOLN
OROMUCOSAL | Status: AC
Start: 2024-01-28 — End: 2024-01-28
  Administered 2024-01-28: 15 mL via OROMUCOSAL
  Filled 2024-01-28: qty 15

## 2024-01-28 MED ORDER — ORAL CARE MOUTH RINSE: 15.0000 mL | Freq: Once | OROMUCOSAL | Status: AC

## 2024-01-28 MED ORDER — CHLORHEXIDINE GLUCONATE 0.12 % MT SOLN: 15.0000 mL | Freq: Once | OROMUCOSAL | Status: AC

## 2024-01-28 NOTE — Progress Notes (Signed)
 By the time I arrived to se the patient, patient had already left. Spoke with nursing staff, please see their note. Unable to reach patient. He reportedly did not wish to have the surgery anymore. Advised to call back [employer]

## 2024-01-28 NOTE — Progress Notes (Signed)
 1600 Seen pt walked out from the room with his clothes on and his IV was out.  He said, he does not want to wait and does not want to have surgery anymore. He was told that the surgeon is already here in OR. Pt keeps on walking out in the hall and left with his wife.  Buffy in OR made aware and she will notify Dr Allena Katz.

## 2024-01-29 ENCOUNTER — Other Ambulatory Visit: Payer: Medicare Other

## 2024-02-05 ENCOUNTER — Ambulatory Visit
Admission: RE | Admit: 2024-02-05 | Discharge: 2024-02-05 | Disposition: A | Discharge status: Home / Self Care | Source: Ambulatory Visit | Attending: Adult Health | Admitting: Adult Health

## 2024-02-05 DIAGNOSIS — Z122 Encounter for screening for malignant neoplasm of respiratory organs: Secondary | ICD-10-CM | POA: Diagnosis not present

## 2024-02-05 DIAGNOSIS — Z720 Tobacco use: Secondary | ICD-10-CM

## 2024-02-05 DIAGNOSIS — F17210 Nicotine dependence, cigarettes, uncomplicated: Secondary | ICD-10-CM | POA: Diagnosis not present

## 2024-02-06 ENCOUNTER — Encounter (INDEPENDENT_AMBULATORY_CARE_PROVIDER_SITE_OTHER): Payer: No Typology Code available for payment source

## 2024-02-23 ENCOUNTER — Encounter: Payer: Self-pay | Admitting: Adult Health

## 2024-02-23 ENCOUNTER — Ambulatory Visit (INDEPENDENT_AMBULATORY_CARE_PROVIDER_SITE_OTHER): Admitting: Adult Health

## 2024-02-23 VITALS — BP 135/78 | HR 99 | Temp 97.2°F | Resp 18 | Ht 66.0 in | Wt 233.0 lb

## 2024-02-23 DIAGNOSIS — F99 Mental disorder, not otherwise specified: Secondary | ICD-10-CM

## 2024-02-23 DIAGNOSIS — F5105 Insomnia due to other mental disorder: Secondary | ICD-10-CM

## 2024-02-23 DIAGNOSIS — Z0000 Encounter for general adult medical examination without abnormal findings: Secondary | ICD-10-CM

## 2024-02-23 MED ORDER — ALPRAZOLAM 0.5 MG PO TABS: 0.5000 mg | ORAL_TABLET | Freq: Every evening | ORAL | 0 refills | Status: AC | PRN

## 2024-02-23 NOTE — Progress Notes (Signed)
 Subjective:   William Pitts is a 68 y.o. male who presents for Medicare Annual/Subsequent preventive examination.  Visit Complete: In person  Patient Medicare AWV questionnaire was completed by the patient on 02/23/24; I have confirmed that all information answered by patient is correct and no changes since this date.  Cardiac Risk Factors include: advanced age (>24men, >16 women);dyslipidemia;male gender;sedentary lifestyle     Objective:    Today's Vitals   02/23/24 1103 02/23/24 1117  BP: 135/78   Pulse: 99   Resp: 18   Temp: (!) 97.2 F (36.2 C)   SpO2: 95%   Weight: 233 lb (105.7 kg)   Height: 5\' 6"  (1.676 m)   PainSc:  1    Body mass index is 37.61 kg/m.     01/23/2024    8:29 AM 11/20/2023   11:01 AM 10/31/2023    9:19 AM 02/16/2018   12:30 PM 12/30/2016   12:13 PM  Advanced Directives  Does Patient Have a Medical Advance Directive? No No No No No  Would patient like information on creating a medical advance directive? No - Patient declined No - Patient declined No - Patient declined No - Patient declined No - Patient declined    Current Medications (verified) Outpatient Encounter Medications as of 02/23/2024  Medication Sig   ALPRAZolam (XANAX) 0.5 MG tablet Take 1 tablet (0.5 mg total) by mouth at bedtime as needed for anxiety.   aspirin EC 81 MG tablet Take 81 mg by mouth in the morning.   atorvastatin (LIPITOR) 20 MG tablet Take 1 tablet (20 mg total) by mouth daily.   cyanocobalamin (VITAMIN B12) 1000 MCG tablet Take 1 tablet (1,000 mcg total) by mouth daily.   Multiple Vitamins-Minerals (MULTIVITAMIN WITH MINERALS) tablet Take 1 tablet by mouth every other day.   Omega-3 Fatty Acids (FISH OIL PO) Take 1 capsule by mouth in the morning.   No facility-administered encounter medications on file as of 02/23/2024.    Allergies (verified) Patient has no known allergies.   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 Social History   Socioeconomic History   Marital status: Married    Spouse name: Not on file   Number of children: 1   Years of education: Not on file   Highest education level: Not on file  Occupational History   Not on file  Tobacco Use   Smoking status: Every Day    Current packs/day: 1.00    Average packs/day: 1 pack/day for 6.0 years (6.0 ttl pk-yrs)    Types: Cigarettes    Start date: 03/05/2018    Last attempt to quit: 02/02/2018   Smokeless tobacco: Never   Tobacco comments:    Sometimes he smokes a pack a day, and sometimes less  Vaping Use   Vaping status: Former   Quit date: 11/19/2011  Substance and Sexual Activity   Alcohol use: Yes    Comment: daily wine- 1 bottle of white wine per day   Drug use: Yes    Frequency: 1.0 times per week    Types: Marijuana  Comment: smokes once a week   Sexual activity: Yes  Other Topics Concern   Not on file  Social History Narrative   From [place]. Has not seen a PCP in 19 years per patient. Smoker. Son committed suicide "over a girl" last month. 01/08/2017. 1  child still living   Social Drivers of Corporate investment banker Strain: Not on file  Food Insecurity: Not on file  Transportation Needs: Not on file  Physical Activity: Not on file  Stress: Not on file  Social Connections: Not on file    Tobacco Counseling Ready to quit: Not Answered Counseling given: Not Answered Tobacco comments: Sometimes he smokes a pack a day, and sometimes less   Clinical Intake:  Pre-visit preparation completed: No  Pain : 0-10 Pain Score: 1  Pain Type: Acute pain Pain Location: Hand Pain Orientation:  Right Pain Descriptors / Indicators: Aching Pain Onset: 1 to 4 weeks ago Pain Frequency: Constant Pain Relieving Factors: takes nothing Effect of Pain on Daily Activities: none  Pain Relieving Factors: takes nothing  BMI - recorded: 37.48 Nutritional Status: BMI > 30  Obese Nutritional Risks: None Diabetes: No  How often do you need to have someone help you when you read instructions, pamphlets, or other written materials from your doctor or pharmacy?: 1 - Never What is the last grade level you completed in school?: 1 year college  Interpreter Needed?: No  Information entered by :: Jissel Slavens Medina-Vargas DNP   Activities of Daily Living    02/23/2024   11:06 AM 01/23/2024    8:31 AM  In your present state of health, do you have any difficulty performing the following activities:  Hearing? 0   Vision? 0   Difficulty concentrating or making decisions? 0   Walking or climbing stairs? 1   Dressing or bathing? 0   Doing errands, shopping? 0 0  Preparing Food and eating ? N   Using the Toilet? N   In the past six months, have you accidently leaked urine? N   Do you have problems with loss of bowel control? N   Managing your Medications? N   Managing your Finances? N   Housekeeping or managing your Housekeeping? N     Patient Care Team: Medina-Vargas, Margit Banda, NP as PCP - General (Internal Medicine)  Indicate any recent Medical Services you may have received from other than [facility] providers in the past year (date may be approximate).     Assessment:   This is a routine wellness examination for [place].  Hearing/Vision screen No results found.   Goals Addressed             This Visit's Progress    Exercise 3x per week (30 min per time)       -  walk outside and in the house       Depression Screen    02/23/2024   11:01 AM 12/29/2023   10:41 AM 11/20/2023   11:01 AM 10/31/2023    9:18 AM 05/05/2023    9:42 AM 12/27/2022   10:28 AM 11/26/2022    1:06 PM  PHQ 2/9 Scores   PHQ - 2 Score 0 0 0 0 0 0 0    Fall Risk    02/23/2024   11:00 AM 12/29/2023   10:40 AM 11/20/2023   11:00 AM 10/31/2023    9:18 AM 05/05/2023    9:42 AM  Fall Risk   Falls in the past year? 0 0 0  1 0  Number falls in past yr: 0 0 1 1 0  Injury with Fall? 0 0 0 0 0  Risk for fall due to : History of fall(s) History of fall(s) History of fall(s)  No Fall Risks  Follow up Falls evaluation completed Falls evaluation completed Falls evaluation completed;Education provided;Falls prevention discussed  Falls evaluation completed    MEDICARE RISK AT HOME:    TIMED UP AND GO:  Was the test performed?  Yes  Length of time to ambulate 10 feet: <10 sec Gait unsteady without use of assistive device, provider informed and interventions were implemented    Cognitive Function:        02/23/2024   11:08 AM  6CIT Screen  What Year? 0 points  What month? 0 points  What time? 0 points  Count back from 20 0 points  Months in reverse 0 points  Repeat phrase 2 points  Total Score 2 points    Immunizations Immunization History  Administered Date(s) Administered   PFIZER(Purple Top)SARS-COV-2 Vaccination 03/18/2020, 04/18/2020    TDAP status: Due, Education has been provided regarding the importance of this vaccine. Advised may receive this vaccine at local pharmacy or Health Dept. Aware to provide a copy of the vaccination record if obtained from local pharmacy or Health Dept. Verbalized acceptance and understanding.  Flu Vaccine status: Declined, Education has been provided regarding the importance of this vaccine but patient still declined. Advised may receive this vaccine at local pharmacy or Health Dept. Aware to provide a copy of the vaccination record if obtained from local pharmacy or Health Dept. Verbalized acceptance and understanding.  Pneumococcal vaccine status: Declined,  Education has been provided regarding the importance of this vaccine but patient still declined. Advised may  receive this vaccine at local pharmacy or Health Dept. Aware to provide a copy of the vaccination record if obtained from local pharmacy or Health Dept. Verbalized acceptance and understanding.   Covid-19 vaccine status: Declined, Education has been provided regarding the importance of this vaccine but patient still declined. Advised may receive this vaccine at local pharmacy or Health Dept.or vaccine clinic. Aware to provide a copy of the vaccination record if obtained from local pharmacy or Health Dept. Verbalized acceptance and understanding.  Qualifies for Shingles Vaccine? Yes   Zostavax completed No   Shingrix Completed?: No.    Education has been provided regarding the importance of this vaccine. Patient has been advised to call insurance company to determine out of pocket expense if they have not yet received this vaccine. Advised may also receive vaccine at local pharmacy or Health Dept. Verbalized acceptance and understanding.  Screening Tests Health Maintenance  Topic Date Due   Pneumonia Vaccine 54+ Years old (1 of 2 - PCV) Never done   Zoster Vaccines- Shingrix (1 of 2) Never done   COVID-19 Vaccine (3 - 2024-25 season) 07/20/2023   Colonoscopy  05/28/2024 (Originally 01/01/2003)   INFLUENZA VACCINE  06/18/2024   Medicare Annual Wellness (AWV)  02/22/2025   Hepatitis C Screening  Completed   HPV VACCINES  Aged Out   DTaP/Tdap/Td  Discontinued    Health Maintenance  Health Maintenance Due  Topic Date Due   Pneumonia Vaccine 47+ Years old (1 of 2 - PCV) Never done   Zoster Vaccines- Shingrix (1 of 2) Never done   COVID-19 Vaccine (3 - 2024-25 season) 07/20/2023    Colorectal cancer screening: Type of screening: Colonoscopy. Completed Declined. Repeat every N/A years  Lung Cancer Screening: (  Low Dose CT Chest recommended if Age 1-80 years, 20 pack-year currently smoking OR have quit w/in 15years.) does qualify. Done 02/05/24  Lung Cancer Screening Referral:  None  Additional Screening:  Hepatitis C Screening: does qualify; Completed 10/31/23  Vision Screening: Recommended annual ophthalmology exams for early detection of glaucoma and other disorders of the eye. Is the patient up to date with their annual eye exam?  Yes  Who is the provider or what is the name of the office in which the patient attends annual eye exams? [facility] If pt is not established with a provider, would they like to be referred to a provider to establish care? No .   Dental Screening: Recommended annual dental exams for proper oral hygiene  Diabetic Foot Exam: Diabetic Foot Exam: Overdue, Pt has been advised about the importance in completing this exam. Pt is scheduled for diabetic foot exam on N/A.  Community Resource Referral / Chronic Care Management: CRR required this visit?  No   CCM required this visit?  No     Plan:     I have personally reviewed and noted the following in the patient's chart:   Medical and social history Use of alcohol, tobacco or illicit drugs  Current medications and supplements including opioid prescriptions. Patient is not currently taking opioid prescriptions. Functional ability and status Nutritional status Physical activity Advanced directives List of other physicians Hospitalizations, surgeries, and ER visits in previous 12 months Vitals Screenings to include cognitive, depression, and falls Referrals and appointments  In addition, I have reviewed and discussed with patient certain preventive protocols, quality metrics, and best practice recommendations. A written personalized care plan for preventive services as well as general preventive health recommendations were provided to patient.     Brennden Masten Medina-Vargas, NP   02/23/2024   After Visit Summary: (In Person-Printed) AVS printed and given to the patient  Nurse Notes: Needs to be done annually

## 2024-02-23 NOTE — Patient Instructions (Signed)
  William Pitts , Thank you for taking time to come for your Medicare Wellness Visit. I appreciate your ongoing commitment to your health goals. Please review the following plan we discussed and let me know if I can assist you in the future.   These are the goals we discussed:  Goals      Exercise 3x per week (30 min per time)     -  walk outside and in the house        This is a list of the screening recommended for you and due dates:  Health Maintenance  Topic Date Due   Pneumonia Vaccine (1 of 2 - PCV) Never done   Zoster (Shingles) Vaccine (1 of 2) Never done   COVID-19 Vaccine (3 - 2024-25 season) 07/20/2023   Colon Cancer Screening  05/28/2024*   Flu Shot  06/18/2024   Medicare Annual Wellness Visit  02/22/2025   Hepatitis C Screening  Completed   HPV Vaccine  Aged Out   DTaP/Tdap/Td vaccine  Discontinued  *Topic was postponed. The date shown is not the original due date.

## 2024-03-15 NOTE — Progress Notes (Signed)
-   has a 4.1 cm ascending aortic aneurysm and recommended to have CTA (CT angiography) or MRA (magnetic resonance angiography) for next year.

## 2024-03-16 ENCOUNTER — Telehealth: Payer: Self-pay

## 2024-03-16 NOTE — Telephone Encounter (Signed)
 Patient called back and results discussed and understood. He states that he has upcoming appointment and he will further discuss during that time.

## 2024-03-16 NOTE — Telephone Encounter (Signed)
 Copied from CRM [id number]. Topic: Clinical - Lab/Test Results >> Mar 16, 2024  3:15 PM Jayson Michael wrote: Reason for CRM     Patient returned a missed call. Upon review of the chart, it appears the call was regarding his CT Chest Lung CA Screen Low Dose W/O CM.  I called [facility] and spoke with Bambi Lever. He attempted to transfer me, but no one was available. I advised the patient that the nurse was currently with another patient and would call him back when available. Patient verbalized understanding.  Please return the patient's call to relay results: [phone].

## 2024-08-27 ENCOUNTER — Ambulatory Visit: Admitting: Adult Health

## 2024-09-01 ENCOUNTER — Ambulatory Visit: Admitting: Adult Health

## 2025-02-25 ENCOUNTER — Ambulatory Visit: Payer: Self-pay | Admitting: Adult Health
